# Patient Record
Sex: Male | Born: 1950 | Race: Black or African American | Hispanic: No | Marital: Single | Smoking: Former smoker
Health system: Southern US, Community
[De-identification: ages and names within clinical notes are randomized; demographics above are authoritative.]

## PROBLEM LIST (undated history)

## (undated) DIAGNOSIS — E785 Hyperlipidemia, unspecified: Secondary | ICD-10-CM

## (undated) DIAGNOSIS — K759 Inflammatory liver disease, unspecified: Secondary | ICD-10-CM

## (undated) DIAGNOSIS — I509 Heart failure, unspecified: Secondary | ICD-10-CM

## (undated) DIAGNOSIS — M1990 Unspecified osteoarthritis, unspecified site: Secondary | ICD-10-CM

## (undated) DIAGNOSIS — I429 Cardiomyopathy, unspecified: Secondary | ICD-10-CM

## (undated) DIAGNOSIS — F419 Anxiety disorder, unspecified: Secondary | ICD-10-CM

## (undated) DIAGNOSIS — B1920 Unspecified viral hepatitis C without hepatic coma: Secondary | ICD-10-CM

## (undated) DIAGNOSIS — I2699 Other pulmonary embolism without acute cor pulmonale: Secondary | ICD-10-CM

## (undated) DIAGNOSIS — K219 Gastro-esophageal reflux disease without esophagitis: Secondary | ICD-10-CM

## (undated) DIAGNOSIS — D649 Anemia, unspecified: Secondary | ICD-10-CM

## (undated) DIAGNOSIS — I10 Essential (primary) hypertension: Secondary | ICD-10-CM

## (undated) DIAGNOSIS — E559 Vitamin D deficiency, unspecified: Secondary | ICD-10-CM

## (undated) DIAGNOSIS — R55 Syncope and collapse: Secondary | ICD-10-CM

## (undated) DIAGNOSIS — M4800 Spinal stenosis, site unspecified: Secondary | ICD-10-CM

## (undated) DIAGNOSIS — R0600 Dyspnea, unspecified: Secondary | ICD-10-CM

## (undated) DIAGNOSIS — Z862 Personal history of diseases of the blood and blood-forming organs and certain disorders involving the immune mechanism: Secondary | ICD-10-CM

## (undated) DIAGNOSIS — J189 Pneumonia, unspecified organism: Secondary | ICD-10-CM

## (undated) DIAGNOSIS — J449 Chronic obstructive pulmonary disease, unspecified: Secondary | ICD-10-CM

## (undated) DIAGNOSIS — K7460 Unspecified cirrhosis of liver: Secondary | ICD-10-CM

## (undated) DIAGNOSIS — J45909 Unspecified asthma, uncomplicated: Secondary | ICD-10-CM

## (undated) DIAGNOSIS — K222 Esophageal obstruction: Secondary | ICD-10-CM

## (undated) DIAGNOSIS — N182 Chronic kidney disease, stage 2 (mild): Secondary | ICD-10-CM

## (undated) HISTORY — DX: Hyperlipidemia, unspecified: E78.5

## (undated) HISTORY — DX: Esophageal obstruction: K22.2

## (undated) HISTORY — DX: Unspecified asthma, uncomplicated: J45.909

## (undated) HISTORY — DX: Syncope and collapse: R55

## (undated) HISTORY — PX: LEG SURGERY: SHX1003

## (undated) HISTORY — PX: ESOPHAGEAL DILATION: SHX303

## (undated) HISTORY — DX: Anemia, unspecified: D64.9

---
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
## 1983-04-25 HISTORY — PX: ELBOW SURGERY: SHX618

## 2011-10-06 DIAGNOSIS — IMO0002 Reserved for concepts with insufficient information to code with codable children: Secondary | ICD-10-CM

## 2011-10-06 HISTORY — DX: Reserved for concepts with insufficient information to code with codable children: IMO0002

## 2012-08-19 DIAGNOSIS — B1920 Unspecified viral hepatitis C without hepatic coma: Secondary | ICD-10-CM

## 2012-08-19 DIAGNOSIS — I10 Essential (primary) hypertension: Secondary | ICD-10-CM

## 2012-08-19 HISTORY — DX: Essential (primary) hypertension: I10

## 2012-08-19 HISTORY — DX: Unspecified viral hepatitis C without hepatic coma: B19.20

## 2013-12-30 DIAGNOSIS — M48061 Spinal stenosis, lumbar region without neurogenic claudication: Secondary | ICD-10-CM

## 2013-12-30 DIAGNOSIS — M545 Low back pain, unspecified: Secondary | ICD-10-CM

## 2013-12-30 DIAGNOSIS — G4700 Insomnia, unspecified: Secondary | ICD-10-CM | POA: Insufficient documentation

## 2013-12-30 DIAGNOSIS — R42 Dizziness and giddiness: Secondary | ICD-10-CM | POA: Insufficient documentation

## 2013-12-30 HISTORY — DX: Insomnia, unspecified: G47.00

## 2013-12-30 HISTORY — DX: Spinal stenosis, lumbar region without neurogenic claudication: M48.061

## 2013-12-30 HISTORY — DX: Dizziness and giddiness: R42

## 2013-12-30 HISTORY — DX: Low back pain, unspecified: M54.50

## 2014-01-27 DIAGNOSIS — M47812 Spondylosis without myelopathy or radiculopathy, cervical region: Secondary | ICD-10-CM

## 2014-01-27 HISTORY — DX: Spondylosis without myelopathy or radiculopathy, cervical region: M47.812

## 2014-10-23 DIAGNOSIS — I2699 Other pulmonary embolism without acute cor pulmonale: Secondary | ICD-10-CM | POA: Insufficient documentation

## 2014-10-24 DIAGNOSIS — R0602 Shortness of breath: Secondary | ICD-10-CM | POA: Insufficient documentation

## 2014-10-24 HISTORY — DX: Shortness of breath: R06.02

## 2015-09-29 DIAGNOSIS — M1612 Unilateral primary osteoarthritis, left hip: Secondary | ICD-10-CM

## 2015-09-29 HISTORY — DX: Unilateral primary osteoarthritis, left hip: M16.12

## 2015-10-05 DIAGNOSIS — J449 Chronic obstructive pulmonary disease, unspecified: Secondary | ICD-10-CM

## 2015-10-05 DIAGNOSIS — R760 Raised antibody titer: Secondary | ICD-10-CM

## 2015-10-05 DIAGNOSIS — K7460 Unspecified cirrhosis of liver: Secondary | ICD-10-CM

## 2015-10-05 HISTORY — DX: Raised antibody titer: R76.0

## 2015-10-05 HISTORY — DX: Chronic obstructive pulmonary disease, unspecified: J44.9

## 2015-10-05 HISTORY — DX: Unspecified cirrhosis of liver: K74.60

## 2015-11-12 DIAGNOSIS — N179 Acute kidney failure, unspecified: Secondary | ICD-10-CM | POA: Diagnosis not present

## 2015-11-12 DIAGNOSIS — J9601 Acute respiratory failure with hypoxia: Secondary | ICD-10-CM

## 2015-11-12 DIAGNOSIS — J441 Chronic obstructive pulmonary disease with (acute) exacerbation: Secondary | ICD-10-CM

## 2015-11-12 DIAGNOSIS — J189 Pneumonia, unspecified organism: Secondary | ICD-10-CM | POA: Diagnosis not present

## 2015-11-12 DIAGNOSIS — E118 Type 2 diabetes mellitus with unspecified complications: Secondary | ICD-10-CM

## 2015-11-12 DIAGNOSIS — I10 Essential (primary) hypertension: Secondary | ICD-10-CM

## 2015-11-13 DIAGNOSIS — J441 Chronic obstructive pulmonary disease with (acute) exacerbation: Secondary | ICD-10-CM | POA: Diagnosis not present

## 2015-11-13 DIAGNOSIS — J9601 Acute respiratory failure with hypoxia: Secondary | ICD-10-CM | POA: Diagnosis not present

## 2015-11-13 DIAGNOSIS — I5022 Chronic systolic (congestive) heart failure: Secondary | ICD-10-CM

## 2015-11-13 DIAGNOSIS — N179 Acute kidney failure, unspecified: Secondary | ICD-10-CM | POA: Diagnosis not present

## 2015-11-13 DIAGNOSIS — J189 Pneumonia, unspecified organism: Secondary | ICD-10-CM | POA: Diagnosis not present

## 2015-11-14 DIAGNOSIS — J189 Pneumonia, unspecified organism: Secondary | ICD-10-CM | POA: Diagnosis not present

## 2015-11-14 DIAGNOSIS — J9601 Acute respiratory failure with hypoxia: Secondary | ICD-10-CM | POA: Diagnosis not present

## 2015-11-14 DIAGNOSIS — N179 Acute kidney failure, unspecified: Secondary | ICD-10-CM | POA: Diagnosis not present

## 2015-11-14 DIAGNOSIS — J441 Chronic obstructive pulmonary disease with (acute) exacerbation: Secondary | ICD-10-CM | POA: Diagnosis not present

## 2015-11-15 DIAGNOSIS — J9601 Acute respiratory failure with hypoxia: Secondary | ICD-10-CM | POA: Diagnosis not present

## 2015-11-15 DIAGNOSIS — J441 Chronic obstructive pulmonary disease with (acute) exacerbation: Secondary | ICD-10-CM | POA: Diagnosis not present

## 2015-11-15 DIAGNOSIS — N179 Acute kidney failure, unspecified: Secondary | ICD-10-CM | POA: Diagnosis not present

## 2015-11-15 DIAGNOSIS — J189 Pneumonia, unspecified organism: Secondary | ICD-10-CM | POA: Diagnosis not present

## 2015-11-16 DIAGNOSIS — J189 Pneumonia, unspecified organism: Secondary | ICD-10-CM | POA: Diagnosis not present

## 2015-11-16 DIAGNOSIS — J9601 Acute respiratory failure with hypoxia: Secondary | ICD-10-CM | POA: Diagnosis not present

## 2015-11-16 DIAGNOSIS — N179 Acute kidney failure, unspecified: Secondary | ICD-10-CM | POA: Diagnosis not present

## 2015-11-16 DIAGNOSIS — J441 Chronic obstructive pulmonary disease with (acute) exacerbation: Secondary | ICD-10-CM | POA: Diagnosis not present

## 2015-11-17 DIAGNOSIS — J189 Pneumonia, unspecified organism: Secondary | ICD-10-CM

## 2015-11-17 DIAGNOSIS — E118 Type 2 diabetes mellitus with unspecified complications: Secondary | ICD-10-CM

## 2015-11-17 DIAGNOSIS — N179 Acute kidney failure, unspecified: Secondary | ICD-10-CM | POA: Diagnosis not present

## 2015-11-17 DIAGNOSIS — I10 Essential (primary) hypertension: Secondary | ICD-10-CM

## 2015-11-17 DIAGNOSIS — J9601 Acute respiratory failure with hypoxia: Secondary | ICD-10-CM

## 2015-11-17 DIAGNOSIS — J441 Chronic obstructive pulmonary disease with (acute) exacerbation: Secondary | ICD-10-CM | POA: Diagnosis not present

## 2015-11-18 DIAGNOSIS — J189 Pneumonia, unspecified organism: Secondary | ICD-10-CM | POA: Diagnosis not present

## 2015-11-18 DIAGNOSIS — J441 Chronic obstructive pulmonary disease with (acute) exacerbation: Secondary | ICD-10-CM | POA: Diagnosis not present

## 2015-11-18 DIAGNOSIS — J9601 Acute respiratory failure with hypoxia: Secondary | ICD-10-CM | POA: Diagnosis not present

## 2015-11-18 DIAGNOSIS — N179 Acute kidney failure, unspecified: Secondary | ICD-10-CM | POA: Diagnosis not present

## 2016-02-02 ENCOUNTER — Other Ambulatory Visit: Payer: Self-pay | Admitting: Orthopedic Surgery

## 2016-03-05 NOTE — Pre-Procedure Instructions (Signed)
    Foy GuadalajaraVester D Belitz  03/05/2016      [facility] - [place] - [address] [place] [address] Phone: [phone] Fax: [phone]    Your procedure is scheduled on 03/20/16.  [facility] at 730 A.M.  Call this number if you have problems the morning of surgery:  [phone]   Remember:  Do not eat food or drink liquids after midnight.  Take these medicines the morning of surgery with A SIP OF WATER --all inhalers,atenolol.oxycodone,flomax   Do not wear jewelry, make-up or nail polish.  Do not wear lotions, powders, or perfumes, or deoderant.  Do not shave 48 hours prior to surgery.  Men may shave face and neck.  Do not bring valuables to the hospital.  [facility] is not responsible for any belongings or valuables.  Contacts, dentures or bridgework may not be worn into surgery.  Leave your suitcase in the car.  After surgery it may be brought to your room.  For patients admitted to the hospital, discharge time will be determined by your treatment team.  Patients discharged the day of surgery will not be allowed to drive home.   Name and phone number of your driver:   Special instructions: Do not take any aspirin,anti-inflammatories,vitamins,or herbal supplements 5-7 days prior to surgery.  Please read over the following fact sheets that you were given.

## 2016-03-06 ENCOUNTER — Encounter (HOSPITAL_COMMUNITY): Payer: Self-pay

## 2016-03-06 ENCOUNTER — Ambulatory Visit (HOSPITAL_COMMUNITY)
Admission: RE | Admit: 2016-03-06 | Discharge: 2016-03-06 | Disposition: A | Discharge status: Home / Self Care | Payer: Medicare Other | Source: Ambulatory Visit | Attending: Orthopedic Surgery | Admitting: Orthopedic Surgery

## 2016-03-06 ENCOUNTER — Other Ambulatory Visit: Payer: Self-pay

## 2016-03-06 ENCOUNTER — Encounter (HOSPITAL_COMMUNITY)
Admission: RE | Admit: 2016-03-06 | Discharge: 2016-03-06 | Disposition: A | Discharge status: Home / Self Care | Payer: Medicare Other | Source: Ambulatory Visit | Attending: Orthopedic Surgery | Admitting: Orthopedic Surgery

## 2016-03-06 DIAGNOSIS — Z01818 Encounter for other preprocedural examination: Secondary | ICD-10-CM | POA: Diagnosis not present

## 2016-03-06 HISTORY — DX: Chronic obstructive pulmonary disease, unspecified: J44.9

## 2016-03-06 HISTORY — DX: Inflammatory liver disease, unspecified: K75.9

## 2016-03-06 HISTORY — DX: Unspecified osteoarthritis, unspecified site: M19.90

## 2016-03-06 HISTORY — DX: Anxiety disorder, unspecified: F41.9

## 2016-03-06 HISTORY — DX: Dyspnea, unspecified: R06.00

## 2016-03-06 HISTORY — DX: Chronic kidney disease, stage 2 (mild): N18.2

## 2016-03-06 HISTORY — DX: Essential (primary) hypertension: I10

## 2016-03-06 LAB — URINALYSIS, ROUTINE W REFLEX MICROSCOPIC
BILIRUBIN URINE: NEGATIVE
GLUCOSE, UA: NEGATIVE mg/dL
HGB URINE DIPSTICK: NEGATIVE
Ketones, ur: NEGATIVE mg/dL
Leukocytes, UA: NEGATIVE
Nitrite: NEGATIVE
PH: 5.5 (ref 5.0–8.0)
Protein, ur: NEGATIVE mg/dL
SPECIFIC GRAVITY, URINE: 1.024 (ref 1.005–1.030)

## 2016-03-06 LAB — CBC WITH DIFFERENTIAL/PLATELET
BASOS ABS: 0 10*3/uL (ref 0.0–0.1)
Basophils Relative: 0 %
EOS PCT: 0 %
Eosinophils Absolute: 0 10*3/uL (ref 0.0–0.7)
HCT: 40.2 % (ref 39.0–52.0)
Hemoglobin: 13.1 g/dL (ref 13.0–17.0)
Lymphocytes Relative: 9 %
Lymphs Abs: 2.4 10*3/uL (ref 0.7–4.0)
MCH: 26.6 pg (ref 26.0–34.0)
MCHC: 32.6 g/dL (ref 30.0–36.0)
MCV: 81.5 fL (ref 78.0–100.0)
MONO ABS: 1.8 10*3/uL — AB (ref 0.1–1.0)
MONOS PCT: 7 %
NEUTROS PCT: 84 %
Neutro Abs: 22.2 10*3/uL — ABNORMAL HIGH (ref 1.7–7.7)
PLATELETS: 308 10*3/uL (ref 150–400)
RBC: 4.93 MIL/uL (ref 4.22–5.81)
RDW: 14.4 % (ref 11.5–15.5)
WBC: 26.4 10*3/uL — AB (ref 4.0–10.5)

## 2016-03-06 LAB — BASIC METABOLIC PANEL
Anion gap: 11 (ref 5–15)
BUN: 22 mg/dL — ABNORMAL HIGH (ref 6–20)
CHLORIDE: 100 mmol/L — AB (ref 101–111)
CO2: 23 mmol/L (ref 22–32)
CREATININE: 1.5 mg/dL — AB (ref 0.61–1.24)
Calcium: 9.1 mg/dL (ref 8.9–10.3)
GFR calc non Af Amer: 47 mL/min — ABNORMAL LOW (ref >60)
GFR, EST AFRICAN AMERICAN: 55 mL/min — AB (ref >60)
Glucose, Bld: 100 mg/dL — ABNORMAL HIGH (ref 65–99)
POTASSIUM: 3.8 mmol/L (ref 3.5–5.1)
SODIUM: 134 mmol/L — AB (ref 135–145)

## 2016-03-06 LAB — TYPE AND SCREEN
ABO/RH(D): A POS
ANTIBODY SCREEN: NEGATIVE

## 2016-03-06 LAB — PROTIME-INR
INR: 1.12
Prothrombin Time: 14.4 seconds (ref 11.4–15.2)

## 2016-03-06 LAB — APTT: aPTT: 37 seconds — ABNORMAL HIGH (ref 24–36)

## 2016-03-06 LAB — ABO/RH: ABO/RH(D): A POS

## 2016-03-06 LAB — SURGICAL PCR SCREEN
MRSA, PCR: NEGATIVE
Staphylococcus aureus: POSITIVE — AB

## 2016-03-07 ENCOUNTER — Encounter (HOSPITAL_COMMUNITY): Payer: Self-pay | Admitting: Emergency Medicine

## 2016-03-07 NOTE — Progress Notes (Addendum)
Anesthesia Chart Review:  Pt is a 65 year old male scheduled for L total hip arthroplasty anterior approach on 03/20/2016 with Todd BirchwoodFrank Rowan, MD.  - PCP is Todd LivingsSami Hassan, MD  - Nephrologist is Todd DeedJeanne Marie Zekan, MD  - Pulmologist is Todd Canneranvir Anwar Chodri, MD  PMH includes:  HTN, hx steroid induced DM, COPD, uses home oxygen at all times, hepatitis C, CKD (stage 2), hx cocaine use (reportedly last in 2014). Former smoker. BMI 23  Medications include: albuterol, ASA, atenolol, pulmicort, symbicort, hctz, lovastatin  Preoperative labs reviewed.   - Cr 1.5, BUN 22. Baseline Cr appears to be around 1.3 per records from Dr. Pamelia Cochran's office.   - WBC 26.4.   CXR 03/06/16: COPD.  No active disease.  EKG 03/06/16: NSR. Nonspecific T wave abnormality  I spoke with Todd Cochran in Dr. Wadie Cochran's office. She has forwarded pt's CBC lab results [facility] and has requested pulmonary clearance from Dr. Blenda Nicelyhodri. Will revisit chart when more information available.   Todd Mastngela Liev Brockbank, FNP-BC [facility]iology Phone: [phone] 03/08/2016 1:28 PM

## 2016-03-08 ENCOUNTER — Encounter (HOSPITAL_COMMUNITY): Payer: Self-pay

## 2016-03-09 DIAGNOSIS — Z01810 Encounter for preprocedural cardiovascular examination: Secondary | ICD-10-CM

## 2016-03-09 HISTORY — DX: Encounter for preprocedural cardiovascular examination: Z01.810

## 2016-03-20 ENCOUNTER — Encounter (HOSPITAL_COMMUNITY): Admission: RE | Payer: Self-pay | Source: Ambulatory Visit

## 2016-03-20 ENCOUNTER — Inpatient Hospital Stay (HOSPITAL_COMMUNITY): Admission: RE | Admit: 2016-03-20 | Payer: Medicare Other | Source: Ambulatory Visit | Admitting: Orthopedic Surgery

## 2016-03-20 SURGERY — ARTHROPLASTY, HIP, TOTAL, ANTERIOR APPROACH
Anesthesia: Spinal | Laterality: Left

## 2016-04-26 DIAGNOSIS — I255 Ischemic cardiomyopathy: Secondary | ICD-10-CM

## 2016-04-26 HISTORY — DX: Ischemic cardiomyopathy: I25.5

## 2016-05-03 DIAGNOSIS — I420 Dilated cardiomyopathy: Secondary | ICD-10-CM

## 2016-05-03 HISTORY — DX: Dilated cardiomyopathy: I42.0

## 2016-10-19 ENCOUNTER — Other Ambulatory Visit: Payer: Self-pay | Admitting: Orthopedic Surgery

## 2016-10-27 ENCOUNTER — Encounter (HOSPITAL_COMMUNITY)
Admission: RE | Admit: 2016-10-27 | Discharge: 2016-10-27 | Disposition: A | Discharge status: Home / Self Care | Payer: Medicare Other | Source: Ambulatory Visit | Attending: Orthopedic Surgery | Admitting: Orthopedic Surgery

## 2016-10-27 ENCOUNTER — Ambulatory Visit (HOSPITAL_COMMUNITY)
Admission: RE | Admit: 2016-10-27 | Discharge: 2016-10-27 | Disposition: A | Discharge status: Home / Self Care | Payer: Medicare Other | Source: Ambulatory Visit | Attending: Orthopedic Surgery | Admitting: Orthopedic Surgery

## 2016-10-27 ENCOUNTER — Encounter (HOSPITAL_COMMUNITY): Payer: Self-pay

## 2016-10-27 DIAGNOSIS — Z01818 Encounter for other preprocedural examination: Secondary | ICD-10-CM | POA: Insufficient documentation

## 2016-10-27 DIAGNOSIS — M1612 Unilateral primary osteoarthritis, left hip: Secondary | ICD-10-CM | POA: Diagnosis not present

## 2016-10-27 HISTORY — DX: Heart failure, unspecified: I50.9

## 2016-10-27 HISTORY — DX: Cardiomyopathy, unspecified: I42.9

## 2016-10-27 HISTORY — DX: Pneumonia, unspecified organism: J18.9

## 2016-10-27 LAB — URINALYSIS, ROUTINE W REFLEX MICROSCOPIC
BILIRUBIN URINE: NEGATIVE
Bacteria, UA: NONE SEEN
Glucose, UA: NEGATIVE mg/dL
Hgb urine dipstick: NEGATIVE
KETONES UR: NEGATIVE mg/dL
Nitrite: NEGATIVE
PH: 5 (ref 5.0–8.0)
Protein, ur: NEGATIVE mg/dL
Specific Gravity, Urine: 1.025 (ref 1.005–1.030)

## 2016-10-27 LAB — CBC WITH DIFFERENTIAL/PLATELET
BASOS ABS: 0 10*3/uL (ref 0.0–0.1)
BASOS PCT: 0 %
EOS ABS: 0.5 10*3/uL (ref 0.0–0.7)
EOS PCT: 4 %
HCT: 41.1 % (ref 39.0–52.0)
Hemoglobin: 13.1 g/dL (ref 13.0–17.0)
Lymphocytes Relative: 28 %
Lymphs Abs: 3 10*3/uL (ref 0.7–4.0)
MCH: 26.5 pg (ref 26.0–34.0)
MCHC: 31.9 g/dL (ref 30.0–36.0)
MCV: 83 fL (ref 78.0–100.0)
Monocytes Absolute: 0.7 10*3/uL (ref 0.1–1.0)
Monocytes Relative: 7 %
Neutro Abs: 6.8 10*3/uL (ref 1.7–7.7)
Neutrophils Relative %: 61 %
PLATELETS: 327 10*3/uL (ref 150–400)
RBC: 4.95 MIL/uL (ref 4.22–5.81)
RDW: 15.8 % — AB (ref 11.5–15.5)
WBC: 11 10*3/uL — ABNORMAL HIGH (ref 4.0–10.5)

## 2016-10-27 LAB — PROTIME-INR
INR: 1.02
Prothrombin Time: 13.4 seconds (ref 11.4–15.2)

## 2016-10-27 LAB — BASIC METABOLIC PANEL
Anion gap: 9 (ref 5–15)
BUN: 21 mg/dL — AB (ref 6–20)
CALCIUM: 9.3 mg/dL (ref 8.9–10.3)
CO2: 28 mmol/L (ref 22–32)
Chloride: 102 mmol/L (ref 101–111)
Creatinine, Ser: 1.49 mg/dL — ABNORMAL HIGH (ref 0.61–1.24)
GFR calc Af Amer: 55 mL/min — ABNORMAL LOW (ref >60)
GFR, EST NON AFRICAN AMERICAN: 48 mL/min — AB (ref >60)
GLUCOSE: 109 mg/dL — AB (ref 65–99)
Potassium: 4.4 mmol/L (ref 3.5–5.1)
Sodium: 139 mmol/L (ref 135–145)

## 2016-10-27 LAB — TYPE AND SCREEN
ABO/RH(D): A POS
Antibody Screen: NEGATIVE

## 2016-10-27 LAB — SURGICAL PCR SCREEN
MRSA, PCR: NEGATIVE
STAPHYLOCOCCUS AUREUS: POSITIVE — AB

## 2016-10-27 LAB — APTT: aPTT: 32 seconds (ref 24–36)

## 2016-10-27 NOTE — Progress Notes (Signed)
PCP is Dr. Quitman LivingsSami Hassan Pulmonary is Dr. Humberto Leepannr Chodri  States he saw him about a week ago. Denies ever seeing a cardiologist. Denies any chest pain, or fever. States he does cough up thick sputum at times. Wears o2 sat today on 2liters 94%  Request sent to Dr Blenda Nicelyhodri for last office visit.

## 2016-10-27 NOTE — Pre-Procedure Instructions (Signed)
Foy GuadalajaraVester D Gervacio  10/27/2016      [facility] - [place] - [address] [place] [address] Phone: [phone] Fax: [phone]  [facility] [place] - [address] [place] [address] Phone: [phone] Fax: [phone]    Your procedure is scheduled on July 18  [facility] Ad[facility] at 1045 A.M.  Call this number if you have problems the morning of surgery:  [phone]   Remember:  Do not eat food or drink liquids after midnight.  Take these medicines the morning of surgery with A SIP OF WATER Symbicort inhaler,  Albuterol inhaler if needed - bring your inhalers with you on the day of surgery, Flexeril, Duoneb, oxycodone if  Needed  Stop taking  BC's, Goody's, Herbal medications, Fish oil, Aleve,n Anaprox,  Ibuprofen, Advil, Motrin Stop/ Take Asprin as directed by your Dr.    Drucilla Schmidto not wear jewelry, make-up or nail polish.  Do not wear lotions, powders, or perfumes, or deoderant.  Do not shave 48 hours prior to surgery.  Men may shave face and neck.  Do not bring valuables to the hospital.  [facility] is not responsible for any belongings or valuables.  Contacts, dentures or bridgework may not be worn into surgery.  Leave your suitcase in the car.  After surgery it may be brought to your room.  For patients admitted to the hospital, discharge time will be determined by your treatment team.  Patients discharged the day of surgery will not be allowed to drive home.    Special instructions:  [place] - Preparing for Surgery  Before surgery, you can play an important role.  Because skin is not sterile, your skin needs to be as free of germs as possible.  You can reduce the number of germs on you skin by washing with CHG (chlorahexidine gluconate) soap before surgery.  CHG is an antiseptic cleaner which kills germs and bonds with the skin to continue  killing germs even after washing.  Please DO NOT use if you have an allergy to CHG or antibacterial soaps.  If your skin becomes reddened/irritated stop using the CHG and inform your nurse when you arrive at Short Stay.  Do not shave (including legs and underarms) for at least 48 hours prior to the first CHG shower.  You may shave your face.  Please follow these instructions carefully:   1.  Shower with CHG Soap the night before surgery and the                                morning of Surgery.  2.  If you choose to wash your hair, wash your hair first as usual with your       normal shampoo.  3.  After you shampoo, rinse your hair and body thoroughly to remove the                      Shampoo.  4.  Use CHG as you would any other liquid soap.  You can apply chg directly       to the skin and wash gently with scrungie or a clean washcloth.  5.  Apply the CHG Soap to your body ONLY FROM THE NECK DOWN.        Do not use on open  wounds or open sores.  Avoid contact with your eyes,       ears, mouth and genitals (private parts).  Wash genitals (private parts)       with your normal soap.  6.  Wash thoroughly, paying special attention to the area where your surgery        will be performed.  7.  Thoroughly rinse your body with warm water from the neck down.  8.  DO NOT shower/wash with your normal soap after using and rinsing off       the CHG Soap.  9.  Pat yourself dry with a clean towel.            10.  Wear clean pajamas.            11.  Place clean sheets on your bed the night of your first shower and do not        sleep with pets.  Day of Surgery  Do not apply any lotions/deoderants the morning of surgery.  Please wear clean clothes to the hospital/surgery center.     Please read over the following fact sheets that you were given. Pain Booklet, Coughing and Deep Breathing, MRSA Information and Surgical Site Infection Prevention, Incentive Spirometry

## 2016-10-27 NOTE — Progress Notes (Signed)
Script For Mupriocin called into [facility]. Message left for Mr Todd Cochran  informed of PCR results and the need to pick up the Script .

## 2016-10-30 ENCOUNTER — Encounter (HOSPITAL_COMMUNITY): Payer: Self-pay

## 2016-10-30 NOTE — Progress Notes (Addendum)
Anesthesia Chart Review:  Pt is a 66 year old male scheduled for L total hip arthroplasty anterior approach, removal of hardware on 11/08/2016 with Gean BirchwoodFrank Rowan, M.D.  - PCP is Quitman LivingsSami Hassan, MD  - Nephrologist is Maud DeedJeanne Marie Zekan, MD   - Pulmologist is Florence Canneranvir Anwar Chodri, MD who cleared pt at moderately high risk due to respiratory issues for surgery at last office visit 10/17/16. Dr. Blenda Nicelyhodri recommends "patient should be given a dose of Solu-Medrol 125 mg IV a few hours before surgery and should be given a nebulizer treatment an hour before and also after anesthesia. Patient should be on the lowest oxygen (post surgery) to keep the pulse ox between 90-92%. Patient may need CPAP after anesthesia."  - Cardiologist is Bonnielee HaffKenneth Wallmeyer, MD who cleared pt for surgery at last office visit 05/18/16 (notes in care everywhere)  - GI/hepatologist is Brooke DareSteven Zacks, MD (notes in care everywhere)   PMH includes:  dilated cardiomyopathy, CHF, HTN, hx steroid induced DM, COPD, uses home oxygen at all times, hepatitis C, CKD (stage 2), hx cocaine use (reportedly last in 2014). Former smoker. BMI 24  Medications include: albuterol, ASA, symbicort, hctz, DuoNeb, lovastatin  Preoperative labs reviewed.   - Cr 1.49, BUN 21.  - BMET only obtained. Will obtain HFP DOS.   CXR 10/27/16: No acute cardiopulmonary disease.  EKG 03/06/16: NSR. Nonspecific T wave abnormality  Cardiac cath 05/03/16:  1. Normal Coronaries. 2. Mild LV dysfunction. EF 40%  Echo 03/30/16 [facility]([place] [facility] [place]): 1. LV cavity normal in size. Visual EF 40-45%. Grade I diastolic dysfunction, normal LAP. LV regional wall motion findings: Basal anterolateral, mid anterolateral, and mid inferoateral hypokinesis. Calculated EF 47%.  If no changes, I anticipate pt can proceed with surgery as scheduled.   Rica Mastngela Kabbe, FNP-BC [facility]/Anesthesiology Phone: [phone] 10/31/2016 10:17 AM

## 2016-11-03 DIAGNOSIS — M1612 Unilateral primary osteoarthritis, left hip: Secondary | ICD-10-CM | POA: Diagnosis present

## 2016-11-03 HISTORY — DX: Unilateral primary osteoarthritis, left hip: M16.12

## 2016-11-03 NOTE — H&P (Signed)
TOTAL HIP ADMISSION H&P  Patient is admitted for left total hip arthroplasty.  Subjective:  Chief Complaint: left hip pain  HPI: Todd Cochran, 66 y.o. male, has a history of pain and functional disability in the left hip(s) due to arthritis and patient has failed non-surgical conservative treatments for greater than 12 weeks to include NSAID's and/or analgesics, corticosteriod injections, use of assistive devices, weight reduction as appropriate and activity modification.  Onset of symptoms was gradual starting 2 years ago with gradually worsening course since that time.The patient noted prior procedures of the hip to include femur fracture requiring plate and screws on the left hip(s).  Patient currently rates pain in the left hip at 10 out of 10 with activity. Patient has night pain, worsening of pain with activity and weight bearing, trendelenberg gait, pain that interfers with activities of daily living and pain with passive range of motion. Patient has evidence of subchondral cysts, subchondral sclerosis and joint space narrowing by imaging studies. This condition presents safety issues increasing the risk of falls. This patient has had mid femur fracture requiring plate and screws.  There is no current active infection.  There are no active problems to display for this patient.  Past Medical History:  Diagnosis Date  . Anxiety   . Arthritis   . Cardiomyopathy (HCC)   . CHF (congestive heart failure) (HCC)   . CKD (chronic kidney disease), stage II   . COPD (chronic obstructive pulmonary disease) (HCC)   . Dyspnea   . Hepatitis    hep c  . Hypertension   . Pneumonia     Past Surgical History:  Procedure Laterality Date  . ELBOW SURGERY Right 1985  . LEG SURGERY     screws,plates    No prescriptions prior to admission.   Allergies  Allergen Reactions  . Ivp Dye [Iodinated Diagnostic Agents] Anaphylaxis    Social History  Substance Use Topics  . Smoking status: Former  Games developermoker  . Smokeless tobacco: Never Used  . Alcohol use No     Comment: stopped 30 yrs    Family History  Problem Relation Age of Onset  . High blood pressure Mother   . Diabetes Mother   . Asthma Sister   . Cancer Brother      Review of Systems  Constitutional: Negative.   HENT: Negative.   Eyes: Negative.   Respiratory: Positive for shortness of breath.        COPD and emphysema  Cardiovascular:       HTN  Gastrointestinal: Negative.   Genitourinary: Negative.   Musculoskeletal: Positive for joint pain and myalgias.  Skin: Negative.   Neurological: Negative.   Endo/Heme/Allergies:       Blood sugar problem  Psychiatric/Behavioral: Negative.     Objective:  Physical Exam  Constitutional: He is oriented to person, place, and time. He appears well-developed and well-nourished.  HENT:  Head: Normocephalic and atraumatic.  Eyes: Pupils are equal, round, and reactive to light.  Neck: Normal range of motion. Neck supple.  Cardiovascular: Intact distal pulses.   Respiratory: Effort normal.  Musculoskeletal: He exhibits tenderness.  Patient has severe pain with any attempted internal or external rotation left hip.  Tender over the greater trochanter.  Foot tap is negative.    Neurological: He is alert and oriented to person, place, and time.  Skin: Skin is warm and dry.  Psychiatric: He has a normal mood and affect. His behavior is normal. Judgment and thought content normal.  Vital signs in last 24 hours:    Labs:   Estimated body mass index is 23.77 kg/m as calculated from the following:   Height as of 10/27/16: 5\' 6"  (1.676 m).   Weight as of 10/27/16: 66.8 kg (147 lb 4.3 oz).   Imaging Review Plain radiographs demonstrate  a left hip with end-stage bone-on-bone arthritis over the superior weightbearing surface.  He also has intact hardware from a previous femur fracture.  This is a plate approximately the midshaft of the femur.  The plate system should not be  affected by an anterior approach/Tri-Lock stem.  Assessment/Plan:  End stage arthritis, left hip(s)  The patient history, physical examination, clinical judgement of the provider and imaging studies are consistent with end stage degenerative joint disease of the left hip(s) and total hip arthroplasty is deemed medically necessary. The treatment options including medical management, injection therapy, arthroscopy and arthroplasty were discussed at length. The risks and benefits of total hip arthroplasty were presented and reviewed. The risks due to aseptic loosening, infection, stiffness, dislocation/subluxation,  thromboembolic complications and other imponderables were discussed.  The patient acknowledged the explanation, agreed to proceed with the plan and consent was signed. Patient is being admitted for inpatient treatment for surgery, pain control, PT, OT, prophylactic antibiotics, VTE prophylaxis, progressive ambulation and ADL's and discharge planning.The patient is planning to be discharged to skilled nursing facility.

## 2016-11-07 MED ORDER — CEFAZOLIN SODIUM-DEXTROSE 2-4 GM/100ML-% IV SOLN
2.0000 g | INTRAVENOUS | Status: AC
  Administered 2016-11-08: 2 g via INTRAVENOUS
  Filled 2016-11-07: qty 100

## 2016-11-07 MED ORDER — ALBUTEROL SULFATE (2.5 MG/3ML) 0.083% IN NEBU
2.5000 mg | INHALATION_SOLUTION | RESPIRATORY_TRACT | Status: DC
  Filled 2016-11-07: qty 3

## 2016-11-07 MED ORDER — TRANEXAMIC ACID 1000 MG/10ML IV SOLN
1000.0000 mg | INTRAVENOUS | Status: AC
  Administered 2016-11-08: 1000 mg via INTRAVENOUS
  Filled 2016-11-07: qty 10

## 2016-11-07 MED ORDER — BUPIVACAINE LIPOSOME 1.3 % IJ SUSP
20.0000 mL | Freq: Once | INTRAMUSCULAR | Status: AC
  Administered 2016-11-08: 20 mL
  Filled 2016-11-07: qty 20

## 2016-11-07 MED ORDER — DEXTROSE-NACL 5-0.45 % IV SOLN: INTRAVENOUS | Status: DC

## 2016-11-07 MED ORDER — TRANEXAMIC ACID 1000 MG/10ML IV SOLN
2000.0000 mg | INTRAVENOUS | Status: AC
  Administered 2016-11-08: 2000 mg via TOPICAL
  Filled 2016-11-07: qty 20

## 2016-11-07 MED ORDER — METHYLPREDNISOLONE SODIUM SUCC 125 MG IJ SOLR
125.0000 mg | Freq: Once | INTRAMUSCULAR | Status: AC
  Administered 2016-11-08: 125 mg via INTRAVENOUS
  Filled 2016-11-07: qty 2

## 2016-11-08 ENCOUNTER — Encounter (HOSPITAL_COMMUNITY): Payer: Self-pay

## 2016-11-08 ENCOUNTER — Inpatient Hospital Stay (HOSPITAL_COMMUNITY)
Admission: RE | Admit: 2016-11-08 | Discharge: 2016-11-09 | DRG: 470 | Disposition: A | Discharge status: Home Health | Payer: Medicare Other | Source: Ambulatory Visit | Attending: Orthopedic Surgery | Admitting: Orthopedic Surgery

## 2016-11-08 ENCOUNTER — Inpatient Hospital Stay (HOSPITAL_COMMUNITY): Payer: Medicare Other | Admitting: Emergency Medicine

## 2016-11-08 ENCOUNTER — Inpatient Hospital Stay (HOSPITAL_COMMUNITY): Payer: Medicare Other

## 2016-11-08 ENCOUNTER — Encounter (HOSPITAL_COMMUNITY)
Admission: RE | Disposition: A | Discharge status: Home Health | Payer: Self-pay | Source: Ambulatory Visit | Attending: Orthopedic Surgery

## 2016-11-08 DIAGNOSIS — M1612 Unilateral primary osteoarthritis, left hip: Secondary | ICD-10-CM | POA: Diagnosis present

## 2016-11-08 DIAGNOSIS — I429 Cardiomyopathy, unspecified: Secondary | ICD-10-CM | POA: Diagnosis present

## 2016-11-08 DIAGNOSIS — M25552 Pain in left hip: Secondary | ICD-10-CM | POA: Diagnosis present

## 2016-11-08 DIAGNOSIS — D62 Acute posthemorrhagic anemia: Secondary | ICD-10-CM | POA: Diagnosis not present

## 2016-11-08 DIAGNOSIS — B1920 Unspecified viral hepatitis C without hepatic coma: Secondary | ICD-10-CM | POA: Diagnosis present

## 2016-11-08 DIAGNOSIS — I509 Heart failure, unspecified: Secondary | ICD-10-CM | POA: Diagnosis present

## 2016-11-08 DIAGNOSIS — N182 Chronic kidney disease, stage 2 (mild): Secondary | ICD-10-CM | POA: Diagnosis present

## 2016-11-08 DIAGNOSIS — J449 Chronic obstructive pulmonary disease, unspecified: Secondary | ICD-10-CM | POA: Diagnosis present

## 2016-11-08 DIAGNOSIS — Z09 Encounter for follow-up examination after completed treatment for conditions other than malignant neoplasm: Secondary | ICD-10-CM

## 2016-11-08 DIAGNOSIS — I130 Hypertensive heart and chronic kidney disease with heart failure and stage 1 through stage 4 chronic kidney disease, or unspecified chronic kidney disease: Secondary | ICD-10-CM | POA: Diagnosis present

## 2016-11-08 HISTORY — PX: TOTAL HIP ARTHROPLASTY: SHX124

## 2016-11-08 LAB — HEPATIC FUNCTION PANEL
ALBUMIN: 3.8 g/dL (ref 3.5–5.0)
ALT: 21 U/L (ref 17–63)
AST: 23 U/L (ref 15–41)
Alkaline Phosphatase: 72 U/L (ref 38–126)
BILIRUBIN DIRECT: 0.1 mg/dL (ref 0.1–0.5)
Indirect Bilirubin: 0.9 mg/dL (ref 0.3–0.9)
TOTAL PROTEIN: 7 g/dL (ref 6.5–8.1)
Total Bilirubin: 1 mg/dL (ref 0.3–1.2)

## 2016-11-08 SURGERY — ARTHROPLASTY, HIP, TOTAL, ANTERIOR APPROACH
Anesthesia: Monitor Anesthesia Care | Laterality: Left

## 2016-11-08 MED ORDER — NAPROXEN SODIUM 550 MG PO TABS: 660.0000 mg | ORAL_TABLET | Freq: Two times a day (BID) | ORAL | Status: DC | PRN

## 2016-11-08 MED ORDER — ALBUTEROL SULFATE (2.5 MG/3ML) 0.083% IN NEBU
2.5000 mg | INHALATION_SOLUTION | Freq: Once | RESPIRATORY_TRACT | Status: AC
  Administered 2016-11-08: 2.5 mg via RESPIRATORY_TRACT

## 2016-11-08 MED ORDER — 0.9 % SODIUM CHLORIDE (POUR BTL) OPTIME
TOPICAL | Status: DC | PRN
  Administered 2016-11-08: 1000 mL

## 2016-11-08 MED ORDER — NYSTATIN 100000 UNIT/ML MT SUSP
5.0000 mL | Freq: Four times a day (QID) | OROMUCOSAL | Status: DC | PRN
  Filled 2016-11-08: qty 5

## 2016-11-08 MED ORDER — IPRATROPIUM-ALBUTEROL 0.5-2.5 (3) MG/3ML IN SOLN
3.0000 mL | Freq: Four times a day (QID) | RESPIRATORY_TRACT | Status: DC
  Administered 2016-11-08 – 2016-11-09 (×2): 3 mL via RESPIRATORY_TRACT
  Filled 2016-11-08 (×2): qty 3

## 2016-11-08 MED ORDER — BLISTEX MEDICATED EX OINT
1.0000 "application " | TOPICAL_OINTMENT | CUTANEOUS | Status: DC | PRN
  Filled 2016-11-08: qty 6.3

## 2016-11-08 MED ORDER — ONDANSETRON HCL 4 MG/2ML IJ SOLN: 4.0000 mg | Freq: Four times a day (QID) | INTRAMUSCULAR | Status: DC | PRN

## 2016-11-08 MED ORDER — BUPIVACAINE-EPINEPHRINE (PF) 0.5% -1:200000 IJ SOLN
INTRAMUSCULAR | Status: AC
  Filled 2016-11-08: qty 60

## 2016-11-08 MED ORDER — HYDROMORPHONE HCL 1 MG/ML IJ SOLN
0.5000 mg | INTRAMUSCULAR | Status: DC | PRN
  Administered 2016-11-08: 0.5 mg via INTRAVENOUS

## 2016-11-08 MED ORDER — PROPOFOL 1000 MG/100ML IV EMUL
INTRAVENOUS | Status: AC
  Filled 2016-11-08: qty 100

## 2016-11-08 MED ORDER — FENTANYL CITRATE (PF) 250 MCG/5ML IJ SOLN
INTRAMUSCULAR | Status: AC
  Filled 2016-11-08: qty 5

## 2016-11-08 MED ORDER — ACETAMINOPHEN 650 MG RE SUPP: 650.0000 mg | Freq: Four times a day (QID) | RECTAL | Status: DC | PRN

## 2016-11-08 MED ORDER — AMITRIPTYLINE HCL 50 MG PO TABS
50.0000 mg | ORAL_TABLET | Freq: Every day | ORAL | Status: DC
  Administered 2016-11-08: 50 mg via ORAL
  Filled 2016-11-08: qty 1

## 2016-11-08 MED ORDER — OXYCODONE HCL 5 MG PO TABS
5.0000 mg | ORAL_TABLET | ORAL | Status: DC | PRN
  Administered 2016-11-08 – 2016-11-09 (×6): 10 mg via ORAL
  Filled 2016-11-08 (×5): qty 2

## 2016-11-08 MED ORDER — FENTANYL CITRATE (PF) 100 MCG/2ML IJ SOLN
INTRAMUSCULAR | Status: DC | PRN
  Administered 2016-11-08: 50 ug via INTRAVENOUS

## 2016-11-08 MED ORDER — HYDROMORPHONE HCL 1 MG/ML IJ SOLN
INTRAMUSCULAR | Status: AC
  Administered 2016-11-08: 0.5 mg via INTRAVENOUS
  Filled 2016-11-08: qty 0.5

## 2016-11-08 MED ORDER — MIDAZOLAM HCL 2 MG/2ML IJ SOLN
INTRAMUSCULAR | Status: AC
  Filled 2016-11-08: qty 2

## 2016-11-08 MED ORDER — POLYETHYLENE GLYCOL 3350 17 G PO PACK: 17.0000 g | PACK | Freq: Every day | ORAL | Status: DC | PRN

## 2016-11-08 MED ORDER — BUPIVACAINE IN DEXTROSE 0.75-8.25 % IT SOLN
INTRATHECAL | Status: DC | PRN
  Administered 2016-11-08: 15 mg via INTRATHECAL

## 2016-11-08 MED ORDER — OXYCODONE HCL 5 MG PO TABS
ORAL_TABLET | ORAL | Status: AC
  Administered 2016-11-08: 10 mg via ORAL
  Filled 2016-11-08: qty 2

## 2016-11-08 MED ORDER — METOCLOPRAMIDE HCL 5 MG PO TABS: 5.0000 mg | ORAL_TABLET | Freq: Three times a day (TID) | ORAL | Status: DC | PRN

## 2016-11-08 MED ORDER — LACTATED RINGERS IV SOLN
INTRAVENOUS | Status: DC | PRN
  Administered 2016-11-08 (×2): via INTRAVENOUS

## 2016-11-08 MED ORDER — FLEET ENEMA 7-19 GM/118ML RE ENEM: 1.0000 | ENEMA | Freq: Once | RECTAL | Status: DC | PRN

## 2016-11-08 MED ORDER — DIPHENHYDRAMINE HCL 12.5 MG/5ML PO ELIX: 12.5000 mg | ORAL_SOLUTION | ORAL | Status: DC | PRN

## 2016-11-08 MED ORDER — PRAVASTATIN SODIUM 20 MG PO TABS
20.0000 mg | ORAL_TABLET | Freq: Every day | ORAL | Status: DC
  Administered 2016-11-08: 20 mg via ORAL
  Filled 2016-11-08: qty 1

## 2016-11-08 MED ORDER — METHOCARBAMOL 1000 MG/10ML IJ SOLN
500.0000 mg | Freq: Four times a day (QID) | INTRAVENOUS | Status: DC | PRN
  Filled 2016-11-08: qty 5

## 2016-11-08 MED ORDER — PHENOL 1.4 % MT LIQD: 1.0000 | OROMUCOSAL | Status: DC | PRN

## 2016-11-08 MED ORDER — ATENOLOL 50 MG PO TABS
25.0000 mg | ORAL_TABLET | Freq: Every day | ORAL | Status: DC
  Administered 2016-11-09: 25 mg via ORAL
  Filled 2016-11-08: qty 1

## 2016-11-08 MED ORDER — DEXAMETHASONE SODIUM PHOSPHATE 10 MG/ML IJ SOLN
10.0000 mg | Freq: Once | INTRAMUSCULAR | Status: AC
  Administered 2016-11-09: 10 mg via INTRAVENOUS
  Filled 2016-11-08: qty 1

## 2016-11-08 MED ORDER — METOCLOPRAMIDE HCL 5 MG/ML IJ SOLN: 5.0000 mg | Freq: Three times a day (TID) | INTRAMUSCULAR | Status: DC | PRN

## 2016-11-08 MED ORDER — PROPOFOL 10 MG/ML IV BOLUS
INTRAVENOUS | Status: DC | PRN
  Administered 2016-11-08: 10 mg via INTRAVENOUS

## 2016-11-08 MED ORDER — METHOCARBAMOL 500 MG PO TABS
500.0000 mg | ORAL_TABLET | Freq: Four times a day (QID) | ORAL | Status: DC | PRN
  Administered 2016-11-08 – 2016-11-09 (×3): 500 mg via ORAL
  Filled 2016-11-08 (×3): qty 1

## 2016-11-08 MED ORDER — SODIUM CHLORIDE 0.9 % IV SOLN
INTRAVENOUS | Status: DC
  Administered 2016-11-08: 12:00:00 via INTRAVENOUS

## 2016-11-08 MED ORDER — PHENYLEPHRINE HCL 10 MG/ML IJ SOLN
INTRAMUSCULAR | Status: DC | PRN
  Administered 2016-11-08: 25 ug/min via INTRAVENOUS

## 2016-11-08 MED ORDER — ALBUTEROL SULFATE (2.5 MG/3ML) 0.083% IN NEBU: 3.0000 mL | INHALATION_SOLUTION | Freq: Four times a day (QID) | RESPIRATORY_TRACT | Status: DC | PRN

## 2016-11-08 MED ORDER — PHENYLEPHRINE 40 MCG/ML (10ML) SYRINGE FOR IV PUSH (FOR BLOOD PRESSURE SUPPORT)
PREFILLED_SYRINGE | INTRAVENOUS | Status: AC
  Filled 2016-11-08: qty 10

## 2016-11-08 MED ORDER — PHENYLEPHRINE HCL 10 MG/ML IJ SOLN
INTRAMUSCULAR | Status: DC | PRN
  Administered 2016-11-08 (×2): 80 ug via INTRAVENOUS

## 2016-11-08 MED ORDER — ALBUMIN HUMAN 5 % IV SOLN
INTRAVENOUS | Status: DC | PRN
  Administered 2016-11-08: 13:00:00 via INTRAVENOUS

## 2016-11-08 MED ORDER — BISACODYL 5 MG PO TBEC: 5.0000 mg | DELAYED_RELEASE_TABLET | Freq: Every day | ORAL | Status: DC | PRN

## 2016-11-08 MED ORDER — ASPIRIN EC 325 MG PO TBEC
325.0000 mg | DELAYED_RELEASE_TABLET | Freq: Every day | ORAL | Status: DC
  Administered 2016-11-09: 325 mg via ORAL
  Filled 2016-11-08: qty 1

## 2016-11-08 MED ORDER — MENTHOL 3 MG MT LOZG: 1.0000 | LOZENGE | OROMUCOSAL | Status: DC | PRN

## 2016-11-08 MED ORDER — TAMSULOSIN HCL 0.4 MG PO CAPS
0.4000 mg | ORAL_CAPSULE | Freq: Every day | ORAL | Status: DC
  Administered 2016-11-08: 0.4 mg via ORAL
  Filled 2016-11-08: qty 1

## 2016-11-08 MED ORDER — TRANEXAMIC ACID 1000 MG/10ML IV SOLN
1000.0000 mg | Freq: Once | INTRAVENOUS | Status: AC
  Administered 2016-11-08: 1000 mg via INTRAVENOUS
  Filled 2016-11-08: qty 10

## 2016-11-08 MED ORDER — OXYCODONE HCL 5 MG PO TABS: 15.0000 mg | ORAL_TABLET | Freq: Four times a day (QID) | ORAL | Status: DC | PRN

## 2016-11-08 MED ORDER — ACETAMINOPHEN 325 MG PO TABS: 650.0000 mg | ORAL_TABLET | Freq: Four times a day (QID) | ORAL | Status: DC | PRN

## 2016-11-08 MED ORDER — DOCUSATE SODIUM 100 MG PO CAPS
100.0000 mg | ORAL_CAPSULE | Freq: Two times a day (BID) | ORAL | Status: DC
  Administered 2016-11-08 – 2016-11-09 (×2): 100 mg via ORAL
  Filled 2016-11-08 (×2): qty 1

## 2016-11-08 MED ORDER — BUPIVACAINE-EPINEPHRINE (PF) 0.5% -1:200000 IJ SOLN
INTRAMUSCULAR | Status: DC | PRN
  Administered 2016-11-08: 50 mL via PERINEURAL

## 2016-11-08 MED ORDER — MIDAZOLAM HCL 5 MG/5ML IJ SOLN
INTRAMUSCULAR | Status: DC | PRN
  Administered 2016-11-08: 2 mg via INTRAVENOUS

## 2016-11-08 MED ORDER — MOMETASONE FURO-FORMOTEROL FUM 200-5 MCG/ACT IN AERO
2.0000 | INHALATION_SPRAY | Freq: Two times a day (BID) | RESPIRATORY_TRACT | Status: DC
  Administered 2016-11-08 – 2016-11-09 (×2): 2 via RESPIRATORY_TRACT
  Filled 2016-11-08: qty 8.8

## 2016-11-08 MED ORDER — PROPOFOL 500 MG/50ML IV EMUL
INTRAVENOUS | Status: DC | PRN
  Administered 2016-11-08: 75 ug/kg/min via INTRAVENOUS

## 2016-11-08 MED ORDER — ONDANSETRON HCL 4 MG PO TABS: 4.0000 mg | ORAL_TABLET | Freq: Four times a day (QID) | ORAL | Status: DC | PRN

## 2016-11-08 MED ORDER — KCL IN DEXTROSE-NACL 20-5-0.45 MEQ/L-%-% IV SOLN: INTRAVENOUS | Status: DC

## 2016-11-08 MED ORDER — ALUMINUM HYDROXIDE GEL 320 MG/5ML PO SUSP
15.0000 mL | ORAL | Status: DC | PRN
  Filled 2016-11-08: qty 30

## 2016-11-08 MED ORDER — HYDROCHLOROTHIAZIDE 25 MG PO TABS
25.0000 mg | ORAL_TABLET | Freq: Every day | ORAL | Status: DC
Start: 2016-11-09 — End: 2016-11-09
  Administered 2016-11-09: 25 mg via ORAL
  Filled 2016-11-08: qty 1

## 2016-11-08 SURGICAL SUPPLY — 43 items
BAG DECANTER FOR FLEXI CONT (MISCELLANEOUS) ×3 IMPLANT
BLADE SAW SGTL 18X1.27X75 (BLADE) ×2 IMPLANT
BLADE SAW SGTL 18X1.27X75MM (BLADE) ×1
CAPT HIP TOTAL 2 ×3 IMPLANT
COVER PERINEAL POST (MISCELLANEOUS) ×3 IMPLANT
COVER SURGICAL LIGHT HANDLE (MISCELLANEOUS) ×3 IMPLANT
DRAPE C-ARM 42X72 X-RAY (DRAPES) ×3 IMPLANT
DRAPE STERI IOBAN 125X83 (DRAPES) ×3 IMPLANT
DRAPE U-SHAPE 47X51 STRL (DRAPES) ×6 IMPLANT
DRSG AQUACEL AG ADV 3.5X10 (GAUZE/BANDAGES/DRESSINGS) ×3 IMPLANT
DURAPREP 26ML APPLICATOR (WOUND CARE) ×3 IMPLANT
ELECT BLADE 4.0 EZ CLEAN MEGAD (MISCELLANEOUS) ×3
ELECT REM PT RETURN 9FT ADLT (ELECTROSURGICAL) ×3
ELECTRODE BLDE 4.0 EZ CLN MEGD (MISCELLANEOUS) ×1 IMPLANT
ELECTRODE REM PT RTRN 9FT ADLT (ELECTROSURGICAL) ×1 IMPLANT
FACESHIELD WRAPAROUND (MASK) ×6 IMPLANT
GLOVE BIO SURGEON STRL SZ7.5 (GLOVE) ×3 IMPLANT
GLOVE BIO SURGEON STRL SZ8.5 (GLOVE) ×3 IMPLANT
GLOVE BIOGEL PI IND STRL 8 (GLOVE) ×1 IMPLANT
GLOVE BIOGEL PI IND STRL 9 (GLOVE) ×1 IMPLANT
GLOVE BIOGEL PI INDICATOR 8 (GLOVE) ×2
GLOVE BIOGEL PI INDICATOR 9 (GLOVE) ×2
GOWN STRL REUS W/ TWL LRG LVL3 (GOWN DISPOSABLE) ×1 IMPLANT
GOWN STRL REUS W/ TWL XL LVL3 (GOWN DISPOSABLE) ×2 IMPLANT
GOWN STRL REUS W/TWL LRG LVL3 (GOWN DISPOSABLE) ×2
GOWN STRL REUS W/TWL XL LVL3 (GOWN DISPOSABLE) ×4
KIT BASIN OR (CUSTOM PROCEDURE TRAY) ×3 IMPLANT
KIT ROOM TURNOVER OR (KITS) ×3 IMPLANT
MANIFOLD NEPTUNE II (INSTRUMENTS) ×3 IMPLANT
NEEDLE HYPO 22GX1.5 SAFETY (NEEDLE) ×6 IMPLANT
NS IRRIG 1000ML POUR BTL (IV SOLUTION) ×3 IMPLANT
PACK TOTAL JOINT (CUSTOM PROCEDURE TRAY) ×3 IMPLANT
PAD ARMBOARD 7.5X6 YLW CONV (MISCELLANEOUS) ×6 IMPLANT
SUT VIC AB 1 CTX 36 (SUTURE) ×2
SUT VIC AB 1 CTX36XBRD ANBCTR (SUTURE) ×1 IMPLANT
SUT VIC AB 2-0 CT1 27 (SUTURE) ×2
SUT VIC AB 2-0 CT1 TAPERPNT 27 (SUTURE) ×1 IMPLANT
SUT VIC AB 3-0 PS2 18 (SUTURE) ×2
SUT VIC AB 3-0 PS2 18XBRD (SUTURE) ×1 IMPLANT
SYR CONTROL 10ML LL (SYRINGE) ×6 IMPLANT
TOWEL OR 17X24 6PK STRL BLUE (TOWEL DISPOSABLE) ×3 IMPLANT
TOWEL OR 17X26 10 PK STRL BLUE (TOWEL DISPOSABLE) ×3 IMPLANT
TRAY CATH 16FR W/PLASTIC CATH (SET/KITS/TRAYS/PACK) IMPLANT

## 2016-11-08 NOTE — Op Note (Signed)
OPERATIVE REPORT    DATE OF PROCEDURE:  11/08/2016       PREOPERATIVE DIAGNOSIS:  LEFT HIP OSTEOARTHRITIS, RETAINED HARDWARE                                                          POSTOPERATIVE DIAGNOSIS:  Left hip osteoarthritis, retained hardware                                                           PROCEDURE: Anterior L total hip arthroplasty using a 48 mm DePuy Pinnacle  Cup, Peabody Energypex Hole Eliminator, 0-degree polyethylene liner, a +5 32 mm ceramic head, a 5 hi Depuy Triloc stem   SURGEON: WJXBJ,YNWGNROWAN,Ladanian Kelter J    ASSISTANT:   Eric K. [employer] PA-C  (present throughout entire procedure and necessary for timely completion of the procedure)   ANESTHESIA: Spinal BLOOD LOSS: 300 FLUID REPLACEMENT: 1600 crystalloid Antibiotic: 2gm ancef Tranexamic Acid: 1gm iv, 2gm topical COMPLICATIONS: none    INDICATIONS FOR PROCEDURE: A 66 y.o. year-old With  LEFT HIP OSTEOARTHRITIS, RETAINED HARDWARE   for 3 years, x-rays show bone-on-bone arthritic changes, and osteophytes. Despite conservative measures with observation, anti-inflammatory medicine, narcotics, use of a cane, has severe unremitting pain and can ambulate only a few blocks before resting. Patient desires elective L total hip arthroplasty to decrease pain and increase function. The risks, benefits, and alternatives were discussed at length including but not limited to the risks of infection, bleeding, nerve injury, stiffness, blood clots, the need for revision surgery, cardiopulmonary complications, among others, and they were willing to proceed. Questions answered     PROCEDURE IN DETAIL: The patient was identified by armband,  received preoperative IV antibiotics [facility], taken to the operating room , appropriate anesthetic monitors  were attached and  anesthesia was induced with the patienton the gurney. The HANA boots were applied to the feet and he was then transferred to the HANA table with a  peroneal post and support underneath the non-operative le, which was locked in 5 lb traction. Theoperative lower extremity was then prepped and draped in the usual sterile fashion from just above the iliac crest to the knee. And a timeout procedure was performed. We then made a 10 cm incision along the interval at the leading edge of the tensor fascia lata of starting at 2 cm lateral to and 2 cm distal to the ASIS. Small bleeders in the skin and subcutaneous tissue identified and cauterized we dissected down to the fascia and made an incision in the fascia allowing us to elevate the fascia of the tensor muscle and exploited the interval between the rectus and the tensor fascia lata. A Hohmann retractor was then placed along the superior neck of the femur and a Cobra retractor along the inferior neck of the femur we teed the capsule starting out at the superior anterior aspect of the acetabulum going distally and made the T along the neck both leaflets of the T were tagged with #2 Ethibond suture. Cobra retractors were then placed along the inferior and superior neck allowing us to  perform a standard neck cut and removed the femoral head with a power corkscrew. We then placed a right angle Hohmann retractor along the anterior aspect of the acetabulum a spiked Cobra in the cotyloid notch and posteriorly a Muelller retractor. We then sequentially reamed up to a 47 mm basket reamer obtaining good coverage in all quadrants, verified by C-arm imaging. Under C-arm control with and hammered into place a 48 mm Pinnacle cup in 45 of abduction and 15 of anteversion. The cup seated nicely and required no supplemental screws. We then placed a central hole Eliminator and a 0 polyethylene liner. The foot was then externally rotated to 110, the HANA elevator was placed around the flare of the greater trochanter and the limb was extended and abducted delivering the proximal femur up into the wound. A medium Hohmann retractor was  placed over the greater trochanter and a Mueller retractor along the posterior femoral neck completing the exposure. We then performed releases superiorly and and inferiorly of the capsule going back to the pirformis fossa superiorly and to the lesser trochanter inferiorly. We then entered the proximal femur with the box cutting offset chisel followed by, a canal sounder, the chili pepper and broaching up to a 5 broach. This seated nicely and we reamed the calcar. A trial reduction was performed with a 5 mm 32 mm head.The limb lengths were excellent the hip was stable in 90 of external rotation. At this point the trial components removed and we hammered into place a # 5 Hi Tri-Lock stem with Gryption coating. This was a Hi offset stem and a + 5 32 mm ceramic ball was then hammered into place the hip was reduced and final C-arm images obtained. The wound was thoroughly irrigated with normal saline solution. We repaired the ant capsule and the tensor fascia lot a with running 0 vicryl suture. the subcutaneous tissue was closed with 2-0 and 3-0 Vicryl suture followed by an Aquacil dressing. At this point the patient was awaken and transferred to hospital gurney without difficulty. The subcutaneous tissue with 0 and 2-0 undyed Vicryl suture and the skin with running  3-0 vicryl subcuticular suture. Aquacil dressing was applied. The patient was then unclamped, rolled supine, awaken extubated and taken to recovery room without difficulty in stable condition.   Gean Birchwood J 11/08/2016, 2:05 PM

## 2016-11-08 NOTE — Anesthesia Preprocedure Evaluation (Addendum)
Anesthesia Evaluation  Patient identified by MRN, date of birth, ID band Patient awake    Reviewed: Allergy & Precautions, NPO status , Patient's Chart, lab work & pertinent test results  History of Anesthesia Complications Negative for: history of anesthetic complications  Airway Mallampati: II  TM Distance: >3 FB Neck ROM: Full    Dental  (+) Dental Advisory Given, Edentulous Upper   Pulmonary COPD, former smoker,    Pulmonary exam normal        Cardiovascular hypertension, +CHF  Normal cardiovascular exam  EF 40%   Neuro/Psych Anxiety negative neurological ROS     GI/Hepatic negative GI ROS, (+) Hepatitis -, C  Endo/Other  negative endocrine ROS  Renal/GU Renal InsufficiencyRenal disease     Musculoskeletal   Abdominal   Peds  Hematology   Anesthesia Other Findings   Reproductive/Obstetrics                            Anesthesia Physical Anesthesia Plan  ASA: III  Anesthesia Plan: MAC and Spinal   Post-op Pain Management:    Induction:   PONV Risk Score and Plan: 2 and Ondansetron and Dexamethasone  Airway Management Planned: Natural Airway and Simple Face Mask  Additional Equipment:   Intra-op Plan:   Post-operative Plan:   Informed Consent: I have reviewed the patients History and Physical, chart, labs and discussed the procedure including the risks, benefits and alternatives for the proposed anesthesia with the patient or authorized representative who has indicated his/her understanding and acceptance.   Dental advisory given  Plan Discussed with: CRNA, Anesthesiologist and Surgeon  Anesthesia Plan Comments:        Anesthesia Quick Evaluation

## 2016-11-08 NOTE — Transfer of Care (Signed)
Immediate Anesthesia Transfer of Care Note  Patient: Todd Cochran  Procedure(s) Performed: Procedure(s): TOTAL HIP ARTHROPLASTY ANTERIOR APPROACH REMOVAL OF HARDWARE (Left)  Patient Location: PACU  Anesthesia Type:MAC and Spinal  Level of Consciousness: awake  Airway & Oxygen Therapy: Patient Spontanous Breathing and Patient connected to face mask oxygen  Post-op Assessment: Report given to RN and Post -op Vital signs reviewed and stable  Post vital signs: Reviewed and stable  Last Vitals:  Vitals:   11/08/16 1034 11/08/16 1041  BP: (!) 161/116 (!) 157/94  Pulse: (!) 101   Resp: 16   Temp: 36.5 C     Last Pain:  Vitals:   11/08/16 1034  TempSrc: Oral      Patients Stated Pain Goal: 2 (24/49/75 3005)  Complications: No apparent anesthesia complications

## 2016-11-08 NOTE — Anesthesia Procedure Notes (Signed)
Procedure Name: MAC Date/Time: 11/08/2016 12:18 PM Performed by: Trixie Deis A Pre-anesthesia Checklist: Patient identified, Emergency Drugs available, Suction available, Patient being monitored and Timeout performed Oxygen Delivery Method: Simple face mask Placement Confirmation: positive ETCO2 Dental Injury: Teeth and Oropharynx as per pre-operative assessment

## 2016-11-08 NOTE — Interval H&P Note (Signed)
History and Physical Interval Note:  11/08/2016 12:14 PM  Todd Cochran  has presented today for surgery, with the diagnosis of LEFT HIP OSTEOARTHRITIS, RETAINED HARDWARE  The various methods of treatment have been discussed with the patient and family. After consideration of risks, benefits and other options for treatment, the patient has consented to  Procedure(s): TOTAL HIP ARTHROPLASTY ANTERIOR APPROACH REMOVAL OF HARDWARE (Left) as a surgical intervention .  The patient's history has been reviewed, patient examined, no change in status, stable for surgery.  I have reviewed the patient's chart and labs.  Questions were answered to the patient's satisfaction.     Nestor LewandowskyOWAN,Freja Faro J

## 2016-11-08 NOTE — Anesthesia Procedure Notes (Signed)
Spinal  Patient location during procedure: OR Start time: 11/08/2016 12:22 PM End time: 11/08/2016 12:28 PM Staffing Anesthesiologist: Heather RobertsSINGER, Todd Cochran Performed: anesthesiologist  Preanesthetic Checklist Completed: patient identified, surgical consent, pre-op evaluation, timeout performed, IV checked, risks and benefits discussed and monitors and equipment checked Spinal Block Patient position: sitting Prep: DuraPrep Patient monitoring: cardiac monitor, continuous pulse ox and blood pressure Approach: midline Location: L2-3 Injection technique: single-shot Needle Needle type: Pencan  Needle gauge: 24 G Needle length: 9 cm Additional Notes Functioning IV was confirmed and monitors were applied. Sterile prep and drape, including hand hygiene and sterile gloves were used. The patient was positioned and the spine was prepped. The skin was anesthetized with lidocaine.  Free flow of clear CSF was obtained prior to injecting local anesthetic into the CSF.  The spinal needle aspirated freely following injection.  The needle was carefully withdrawn.  The patient tolerated the procedure well.

## 2016-11-09 LAB — CBC
HEMATOCRIT: 31.6 % — AB (ref 39.0–52.0)
Hemoglobin: 10.1 g/dL — ABNORMAL LOW (ref 13.0–17.0)
MCH: 26 pg (ref 26.0–34.0)
MCHC: 32 g/dL (ref 30.0–36.0)
MCV: 81.4 fL (ref 78.0–100.0)
PLATELETS: 264 10*3/uL (ref 150–400)
RBC: 3.88 MIL/uL — AB (ref 4.22–5.81)
RDW: 15.4 % (ref 11.5–15.5)
WBC: 15.6 10*3/uL — AB (ref 4.0–10.5)

## 2016-11-09 LAB — BASIC METABOLIC PANEL
ANION GAP: 8 (ref 5–15)
BUN: 20 mg/dL (ref 6–20)
CO2: 25 mmol/L (ref 22–32)
Calcium: 8.6 mg/dL — ABNORMAL LOW (ref 8.9–10.3)
Chloride: 104 mmol/L (ref 101–111)
Creatinine, Ser: 1.16 mg/dL (ref 0.61–1.24)
GFR calc Af Amer: >60 mL/min (ref >60)
GLUCOSE: 138 mg/dL — AB (ref 65–99)
POTASSIUM: 4.3 mmol/L (ref 3.5–5.1)
Sodium: 137 mmol/L (ref 135–145)

## 2016-11-09 MED ORDER — ASPIRIN EC 325 MG PO TBEC: 325.0000 mg | DELAYED_RELEASE_TABLET | Freq: Two times a day (BID) | ORAL | 0 refills | Status: DC

## 2016-11-09 MED ORDER — OXYCODONE-ACETAMINOPHEN 5-325 MG PO TABS: 1.0000 | ORAL_TABLET | ORAL | 0 refills | Status: DC | PRN

## 2016-11-09 NOTE — Discharge Summary (Signed)
Patient ID: Todd Cochran MRN: [id number] DOB/AGE: [birth year] 66 y.o.  Admit date: 11/08/2016 Discharge date: 11/09/2016  Admission Diagnoses:  Principal Problem:   Osteoarthritis of left hip Active Problems:   Primary osteoarthritis of left hip   Discharge Diagnoses:  Same  Past Medical History:  Diagnosis Date  . Anxiety   . Arthritis   . Cardiomyopathy (HCC)   . CHF (congestive heart failure) (HCC)   . CKD (chronic kidney disease), stage II   . COPD (chronic obstructive pulmonary disease) (HCC)   . Dyspnea   . Hepatitis    hep c  . Hypertension   . Pneumonia     Surgeries: Procedure(s): TOTAL HIP ARTHROPLASTY ANTERIOR APPROACH REMOVAL OF HARDWARE on 11/08/2016   Consultants:   Discharged Condition: Improved  Hospital Course: Todd Cochran is an 66 y.o. male who was admitted 11/08/2016 for operative treatment ofOsteoarthritis of left hip. Patient has severe unremitting pain that affects sleep, daily activities, and work/hobbies. After pre-op clearance the patient was taken to the operating room on 11/08/2016 and underwent  Procedure(s): TOTAL HIP ARTHROPLASTY ANTERIOR APPROACH REMOVAL OF HARDWARE.    Patient was given perioperative antibiotics: Anti-infectives    Start     Dose/Rate Route Frequency Ordered Stop   11/08/16 1230  ceFAZolin (ANCEF) IVPB 2g/100 mL premix     2 g 200 mL/hr over 30 Minutes Intravenous To ShortStay Surgical 11/07/16 1211 11/08/16 1248       Patient was given sequential compression devices, early ambulation, and chemoprophylaxis to prevent DVT.  Patient benefited maximally from hospital stay and there were no complications.    Recent vital signs: Patient Vitals for the past 24 hrs:  BP Temp Temp src Pulse Resp SpO2 Height Weight  11/09/16 0500 (!) 144/94 98.2 F (36.8 C) Oral 99 18 98 % - -  11/08/16 2220 (!) 153/93 99.9 F (37.7 C) Oral (!) 110 18 95 % - -  11/08/16 2036 - - - - - 97 % - -  11/08/16 2035 - - - - - 97 % - -   11/08/16 1645 (!) 154/102 97.7 F (36.5 C) Oral 99 18 94 % - -  11/08/16 1630 (!) 159/95 - - 95 20 97 % - -  11/08/16 1615 - - - 93 18 98 % - -  11/08/16 1600 (!) 142/106 - - 93 16 96 % - -  11/08/16 1545 (!) 148/107 - - 92 15 99 % - -  11/08/16 1530 - - - 89 14 99 % - -  11/08/16 1515 (!) 139/104 - - 90 14 99 % - -  11/08/16 1500 (!) 133/94 - - 92 19 97 % - -  11/08/16 1445 (!) 130/95 - - 94 17 96 % - -  11/08/16 1430 - - - 94 10 96 % - -  11/08/16 1424 (!) 128/95 98.6 F (37 C) - 96 18 98 % - -  11/08/16 1111 - - - - - - 5\' 6"  (1.676 m) 66.7 kg (147 lb)  11/08/16 1041 (!) 157/94 - - - - - - -  11/08/16 1034 (!) 161/116 97.7 F (36.5 C) Oral (!) 101 16 96 % - 66.7 kg (147 lb)     Recent laboratory studies:  Recent Labs  11/09/16 0349  WBC 15.6*  HGB 10.1*  HCT 31.6*  PLT 264  NA 137  K 4.3  CL 104  CO2 25  BUN 20  CREATININE 1.16  GLUCOSE 138*  CALCIUM  8.6*     Discharge Medications:   Allergies as of 11/09/2016      Reactions   Ivp Dye [iodinated Diagnostic Agents] Anaphylaxis      Medication List    STOP taking these medications   naproxen sodium 220 MG tablet Commonly known as:  ANAPROX     TAKE these medications   albuterol 108 (90 Base) MCG/ACT inhaler Commonly known as:  PROVENTIL HFA;VENTOLIN HFA Inhale 2 puffs into the lungs every 6 (six) hours as needed for wheezing or shortness of breath.   amitriptyline 50 MG tablet Commonly known as:  ELAVIL Take 50 mg by mouth at bedtime.   aspirin EC 325 MG tablet Take 1 tablet (325 mg total) by mouth 2 (two) times daily. What changed:  medication strength  how much to take  when to take this   atenolol 25 MG tablet Commonly known as:  TENORMIN Take 25 mg by mouth daily.   budesonide-formoterol 160-4.5 MCG/ACT inhaler Commonly known as:  SYMBICORT Inhale 2 puffs into the lungs 2 (two) times daily.   cyclobenzaprine 10 MG tablet Commonly known as:  FLEXERIL Take 10 mg by mouth daily.    docusate sodium 100 MG capsule Commonly known as:  COLACE Take 100-200 mg by mouth every evening.   hydrochlorothiazide 25 MG tablet Commonly known as:  HYDRODIURIL Take 25 mg by mouth daily.   ipratropium-albuterol 0.5-2.5 (3) MG/3ML Soln Commonly known as:  DUONEB Inhale 3 mLs into the lungs 4 (four) times daily.   lip balm Oint Apply 1 application topically as needed for lip care.   lovastatin 20 MG tablet Commonly known as:  MEVACOR Take 20 mg by mouth at bedtime.   nystatin 100000 UNIT/ML suspension Commonly known as:  MYCOSTATIN Use as directed 5 mLs in the mouth or throat 4 (four) times daily as needed. For raw/irritated throat   oxyCODONE 15 MG immediate release tablet Commonly known as:  ROXICODONE Take 15 mg by mouth every 6 (six) hours as needed for pain.   oxyCODONE-acetaminophen 5-325 MG tablet Commonly known as:  ROXICET Take 1 tablet by mouth every 4 (four) hours as needed for severe pain.   tamsulosin 0.4 MG Caps capsule Commonly known as:  FLOMAX Take 0.4 mg by mouth at bedtime.            Durable Medical Equipment        Start     Ordered   11/08/16 1646  DME Walker rolling  Once    Question:  Patient needs a walker to treat with the following condition  Answer:  Status post total hip replacement, left   11/08/16 1645   11/08/16 1646  DME 3 n 1  Once     11/08/16 1645   11/08/16 1646  DME Bedside commode  Once    Question:  Patient needs a bedside commode to treat with the following condition  Answer:  Status post total hip replacement, left   11/08/16 1645      Diagnostic Studies: Dg Chest 2 View  Result Date: 10/27/2016 CLINICAL DATA:  Pre-op chest TOTAL HIP ARTHROPLASTY ANTERIOR APPROACH REMOVAL OF HARDWARE 11/08/2016 Hx of pneumonia COP Hypertension X smoker 2014 EXAM: CHEST - 2 VIEW COMPARISON:  04/26/2016 FINDINGS: Linear scarring in the left mid lung as before. Lungs otherwise clear, hyperinflated. Heart size and mediastinal contours  are within normal limits. No effusion. Old left rib fracture deformities. IMPRESSION: No acute cardiopulmonary disease. Electronically Signed   By: Algis Downs  Todd Cochran M.D.   On: 10/27/2016 13:51   Dg C-arm 1-60 Min  Result Date: 11/08/2016 CLINICAL DATA:  In tear left hip replacement EXAM: DG C-ARM 61-120 MIN; OPERATIVE LEFT HIP WITH PELVIS COMPARISON:  None. FINDINGS: Left hip arthroplasty has a good appearance. Components well position without detectable complication. IMPRESSION: Good appearance following left hip arthroplasty. Electronically Signed   By: Paulina Fusi M.D.   On: 11/08/2016 14:31   Dg Hip Operative Unilat W Or W/o Pelvis Left  Result Date: 11/08/2016 CLINICAL DATA:  In tear left hip replacement EXAM: DG C-ARM 61-120 MIN; OPERATIVE LEFT HIP WITH PELVIS COMPARISON:  None. FINDINGS: Left hip arthroplasty has a good appearance. Components well position without detectable complication. IMPRESSION: Good appearance following left hip arthroplasty. Electronically Signed   By: Paulina Fusi M.D.   On: 11/08/2016 14:31    Disposition: Final discharge disposition not confirmed  Discharge Instructions    Call MD / Call 911    Complete by:  As directed    If you experience chest pain or shortness of breath, CALL 911 and be transported to the hospital emergency room.  If you develope a fever above 101 F, pus (white drainage) or increased drainage or redness at the wound, or calf pain, call your surgeon's office.   Change dressing    Complete by:  As directed    You may change your dressing only if drainage in window is > 40%   Constipation Prevention    Complete by:  As directed    Drink plenty of fluids.  Prune juice may be helpful.  You may use a stool softener, such as Colace (over the counter) 100 mg twice a day.  Use MiraLax (over the counter) for constipation as needed.   Diet - low sodium heart healthy    Complete by:  As directed    Follow the hip precautions as taught in Physical Therapy     Complete by:  As directed    Increase activity slowly as tolerated    Complete by:  As directed       Follow-up Information    Gean Birchwood, MD Follow up in 2 week(s).   Specialty:  Orthopedic Surgery Contact information: [address] [place] [address] [phone]            Signed: Nestor Lewandowsky 11/09/2016, 7:24 AM

## 2016-11-09 NOTE — Progress Notes (Signed)
Pt discharge education and instructions completed with pt and significant other at bedside. Both voices understanding and denies any questions. Pt IV removed; surgical incision dsg remains clean, dry and intact with no stain or active drainage noted. Pt discharge home with partner to transport him home. Pt handed his prescription for Roxicet. Pt transported off unit via wheelchair with partner and belongings to the side. Dionne BucyP. Amo Terron Merfeld RN

## 2016-11-09 NOTE — Progress Notes (Signed)
Physical Therapy Treatment Patient Details Name: Todd Cochran MRN: [id number] DOB: [birth year] Today's Date: 11/09/2016    History of Present Illness 66 yo admitted for Lt THA. PMhx: TKA, arthritis, cardiomyopathy, CHF, CKD, COPD on home O2, Hep C, HTN    PT Comments    Pt continues to mobilize well without physical assist. Pt with 2/4 on DOE scale after ambulating entire until and SpO2 decreased to 87% on 2 L O2. Pt instructed in deep breathing and SpO2 increased to 94%. Pt tolerated standing exercises well and shortness of breath continues to be pt's limitation to mobility. Will continue to follow for mobilization and increased activity tolerance for returning to PLOF and safe d/c.   Vitals:  Pre ambulation on 2 L O2: SpO2 96 HR 97  During ambulation with 2 L O2: SpO2 ranged from 94 down to 87 and HR 114 Post ambulation with 2 L O2: SpO2 94 and HR 111   Follow Up Recommendations  DC plan and follow up therapy as arranged by surgeon     Equipment Recommendations  None recommended by PT    Recommendations for Other Services       Precautions / Restrictions Precautions Precautions: Fall Restrictions Weight Bearing Restrictions: Yes LLE Weight Bearing: Weight bearing as tolerated    Mobility  Bed Mobility Overal bed mobility: Modified Independent             General bed mobility comments: in chair on arrival, bed end of session  Transfers Overall transfer level: Needs assistance   Transfers: Sit to/from Stand Sit to Stand: Supervision         General transfer comment: cues for hand placement  Ambulation/Gait Ambulation/Gait assistance: Supervision Ambulation Distance (Feet): 300 Feet Assistive device: 4-wheeled walker Gait Pattern/deviations: Decreased stride length;Step-through pattern;Trunk flexed   Gait velocity interpretation: Below normal speed for age/gender General Gait Details: cues for posture and proximity to rollator   Stairs            Wheelchair Mobility    Modified Rankin (Stroke Patients Only)       Balance Overall balance assessment: Needs assistance Sitting-balance support: No upper extremity supported;Feet unsupported Sitting balance-Leahy Scale: Good Sitting balance - Comments: Able to sit EOB without support   Standing balance support: Bilateral upper extremity supported;During functional activity Standing balance-Leahy Scale: Fair Standing balance comment: Relies on Bil UE support during standing functional activity; however pt stable                             Cognition Arousal/Alertness: Awake/alert Behavior During Therapy: WFL for tasks assessed/performed Overall Cognitive Status: Within Functional Limits for tasks assessed                                        Exercises Total Joint Exercises Hip ABduction/ADduction: AROM;Left;Standing;10 reps Knee Flexion: AROM;Left;Standing;10 reps Marching in Standing: AROM;Left;10 reps;Standing Standing Hip Extension: AROM;Left;Standing;10 reps    General Comments        Pertinent Vitals/Pain Pain Assessment: 0-10 Pain Score: 7  Pain Location: left hip Pain Descriptors / Indicators: Sore Pain Intervention(s): Limited activity within patient's tolerance;Monitored during session;Repositioned    Home Living                      Prior Function  PT Goals (current goals can now be found in the care plan section) Acute Rehab PT Goals Patient Stated Goal: return home PT Goal Formulation: With patient Time For Goal Achievement: 11/23/16 Potential to Achieve Goals: Good Progress towards PT goals: Progressing toward goals    Frequency    7X/week      PT Plan Current plan remains appropriate    Co-evaluation              AM-PAC PT "6 Clicks" Daily Activity  Outcome Measure  Difficulty turning over in bed (including adjusting bedclothes, sheets and blankets)?: A Little Difficulty  moving from lying on back to sitting on the side of the bed? : A Little Difficulty sitting down on and standing up from a chair with arms (e.g., wheelchair, bedside commode, etc,.)?: A Little Help needed moving to and from a bed to chair (including a wheelchair)?: None Help needed walking in hospital room?: A Little Help needed climbing 3-5 steps with a railing? : A Little 6 Click Score: 19    End of Session Equipment Utilized During Treatment: Gait belt Activity Tolerance: Patient tolerated treatment well Patient left: in bed;with call bell/phone within reach;with family/visitor present;with bed alarm set Nurse Communication: Mobility status PT Visit Diagnosis: Difficulty in walking, not elsewhere classified (R26.2);Pain;Muscle weakness (generalized) (M62.81) Pain - Right/Left: Left Pain - part of body: Hip     Time: 1140-1200 PT Time Calculation (min) (ACUTE ONLY): 20 min  Charges:  $Gait Training: 8-22 mins                    G Codes:       Arneta ClicheVictoria Adra Shepler, SPT Acute Rehab [phone]    Arneta ClicheVictoria Jimi Giza 11/09/2016, 1:17 PM

## 2016-11-09 NOTE — Plan of Care (Signed)
Problem: Safety: Goal: Ability to remain free from injury will improve Outcome: Progressing Call light and Patient belongings in reach. Patient calls for assistance when needed.

## 2016-11-09 NOTE — Discharge Instructions (Signed)

## 2016-11-09 NOTE — Evaluation (Signed)
Physical Therapy Evaluation Patient Details Name: Todd ShuttersVester D Bera MRN: [id number] DOB: 06/04/1950 Today's Date: 11/09/2016   History of Present Illness  66 yo admitted for Lt THA. PMhx: TKA, arthritis, cardiomyopathy, CHF, CKD, COPD on home O2, Hep C, HTN  Clinical Impression  Pt eager to mobilize and required max VCs for slowing down to reduce impulsive behavior with transfers. Pt required no physical assistance with transfers or gait; however safety still remains a concern during functional activities d/t impulsive transfers. Pt also became SOB during ambulation, after which significant other mentioned the patient was on 2L continuous O2 at home PTA. Pt's SpO2 decreased below 90 during ambulation (see vitals below) and increased post ambulation after returned to 1.5L O2 via nasal cannula. Pt presents with deficits listed in PT problem list below and will benefit from continued acute therapy for mobilization, safety awareness and strengthening to safely d/c and return to PLOF.   Vitals: Pre ambulation on RA: 95% SpO2 and HR 103 Post ambulation on RA: 83% SpO2 and HR 119 Post ambulation on 1.5 L O2: 95% SpO2 and HR 96    Follow Up Recommendations DC plan and follow up therapy as arranged by surgeon    Equipment Recommendations  None recommended by PT    Recommendations for Other Services       Precautions / Restrictions Precautions Precautions: Fall Restrictions LLE Weight Bearing: Weight bearing as tolerated      Mobility  Bed Mobility Overal bed mobility: Modified Independent                Transfers Overall transfer level: Needs assistance   Transfers: Sit to/from Stand Sit to Stand: Supervision            Ambulation/Gait Ambulation/Gait assistance: Supervision Ambulation Distance (Feet): 200 Feet Assistive device: 4-wheeled walker Gait Pattern/deviations: Decreased stride length;Step-through pattern   Gait velocity interpretation: Below normal speed for  age/gender    Stairs            Wheelchair Mobility    Modified Rankin (Stroke Patients Only)       Balance Overall balance assessment: Needs assistance Sitting-balance support: No upper extremity supported;Feet unsupported Sitting balance-Leahy Scale: Good Sitting balance - Comments: Able to sit EOB without support   Standing balance support: Bilateral upper extremity supported;During functional activity Standing balance-Leahy Scale: Poor Standing balance comment: Relies on Bil UE support during standing functional activity                              Pertinent Vitals/Pain Pain Assessment: 0-10 Pain Score: 2  Pain Descriptors / Indicators: Sore Pain Intervention(s): Monitored during session;Limited activity within patient's tolerance;Repositioned    Home Living Family/patient expects to be discharged to:: Private residence Living Arrangements: Spouse/significant other Available Help at Discharge: Family;Available 24 hours/day Type of Home: House Home Access: Level entry     Home Layout: One level Home Equipment: Walker - 4 wheels;Bedside commode;Cane - single point;Walker - 2 wheels      Prior Function Level of Independence: Independent with assistive device(s)         Comments: walks with cane     Hand Dominance        Extremity/Trunk Assessment                Communication   Communication: No difficulties  Cognition Arousal/Alertness: Awake/alert Behavior During Therapy: WFL for tasks assessed/performed;Impulsive (Impulsive with transfers) Overall Cognitive Status: Within Functional Limits  for tasks assessed                                        General Comments      Exercises Total Joint Exercises Hip ABduction/ADduction: AROM;Seated;Left;10 reps Long Arc Quad: AROM;Left;Seated;10 reps Marching in Standing: AROM;Left;Seated;10 reps   Assessment/Plan    PT Assessment Patient needs continued PT  services  PT Problem List Decreased strength;Decreased mobility;Decreased activity tolerance;Cardiopulmonary status limiting activity;Decreased balance;Decreased knowledge of use of DME;Pain       PT Treatment Interventions Gait training;Therapeutic exercise;Patient/family education;DME instruction;Therapeutic activities;Functional mobility training    PT Goals (Current goals can be found in the Care Plan section)  Acute Rehab PT Goals Patient Stated Goal: return home PT Goal Formulation: With patient Time For Goal Achievement: 11/16/16 Potential to Achieve Goals: Good    Frequency 7X/week   Barriers to discharge        Co-evaluation               AM-PAC PT "6 Clicks" Daily Activity  Outcome Measure Difficulty turning over in bed (including adjusting bedclothes, sheets and blankets)?: A Little Difficulty moving from lying on back to sitting on the side of the bed? : A Little Difficulty sitting down on and standing up from a chair with arms (e.g., wheelchair, bedside commode, etc,.)?: A Little Help needed moving to and from a bed to chair (including a wheelchair)?: A Little Help needed walking in hospital room?: A Little Help needed climbing 3-5 steps with a railing? : A Little 6 Click Score: 18    End of Session Equipment Utilized During Treatment: Gait belt Activity Tolerance: Patient tolerated treatment well Patient left: in chair;with call bell/phone within reach;with family/visitor present;with chair alarm set Nurse Communication: Mobility status;Weight bearing status PT Visit Diagnosis: Difficulty in walking, not elsewhere classified (R26.2);Pain;Muscle weakness (generalized) (M62.81) Pain - Right/Left: Left Pain - part of body: Hip    Time: 0732-0759 PT Time Calculation (min) (ACUTE ONLY): 27 min   Charges:   PT Evaluation $PT Eval Moderate Complexity: 1 Procedure PT Treatments $Gait Training: 8-22 mins   PT G CodesArneta Cliche,  SPT Acute Rehab [phone] 11/09/2016, 8:46 AM

## 2016-11-09 NOTE — Anesthesia Postprocedure Evaluation (Signed)
Anesthesia Post Note  Patient: Todd Cochran  Procedure(s) Performed: Procedure(s) (LRB): TOTAL HIP ARTHROPLASTY ANTERIOR APPROACH REMOVAL OF HARDWARE (Left)     Patient location during evaluation: PACU Anesthesia Type: MAC and Spinal Level of consciousness: oriented and awake and alert Pain management: pain level controlled Vital Signs Assessment: post-procedure vital signs reviewed and stable Respiratory status: spontaneous breathing and respiratory function stable Cardiovascular status: blood pressure returned to baseline and stable Postop Assessment: no headache and no backache Anesthetic complications: no    Last Vitals:  Vitals:   11/09/16 1211 11/09/16 1249  BP: (!) 134/93   Pulse: 98 97  Resp: (!) 21   Temp: 36.6 C     Last Pain:  Vitals:   11/09/16 1357  TempSrc:   PainSc: [place]

## 2016-11-09 NOTE — Progress Notes (Signed)
PATIENT ID: Todd Cochran  MRN: [id number]  DOB/AGE:  Nov 08, 1950 / 66 y.o.  1 Day Post-Op Procedure(s) (LRB): TOTAL HIP ARTHROPLASTY ANTERIOR APPROACH REMOVAL OF HARDWARE (Left)    PROGRESS NOTE Subjective: Patient is alert, oriented, no Nausea, no Vomiting, yes passing gas, . Taking PO well. Denies SOB, Chest or Calf Pain. Using Incentive Spirometer, PAS in place. Ambulate WBAT Patient reports pain as  2/10  .    Objective: Vital signs in last 24 hours: Vitals:   11/08/16 2035 11/08/16 2036 11/08/16 2220 11/09/16 0500  BP:   (!) 153/93 (!) 144/94  Pulse:   (!) 110 99  Resp:   18 18  Temp:   99.9 F (37.7 C) 98.2 F (36.8 C)  TempSrc:   Oral Oral  SpO2: 97% 97% 95% 98%  Weight:      Height:          Intake/Output from previous day: I/O last 3 completed shifts: In: 1740 [P.O.:120; I.V.:1260; IV Piggyback:360] Out: 1950 [Urine:1500; Blood:450]   Intake/Output this shift: No intake/output data recorded.   LABORATORY DATA:  Recent Labs  11/09/16 0349  WBC 15.6*  HGB 10.1*  HCT 31.6*  PLT 264  NA 137  K 4.3  CL 104  CO2 25  BUN 20  CREATININE 1.16  GLUCOSE 138*  CALCIUM 8.6*    Examination: Neurologically intact ABD soft Neurovascular intact Sensation intact distally Intact pulses distally Dorsiflexion/Plantar flexion intact Incision: dressing C/D/I No cellulitis present Compartment soft} XR AP&Lat of hip shows well placed\fixed THA  Assessment:   1 Day Post-Op Procedure(s) (LRB): TOTAL HIP ARTHROPLASTY ANTERIOR APPROACH REMOVAL OF HARDWARE (Left) ADDITIONAL DIAGNOSIS:  Expected Acute Blood Loss Anemia, Hypertension, COPD, Hep C, Dialated cardiomyopathy hx  Plan: PT/OT WBAT, THA  DVT Prophylaxis: SCDx72 hrs, ASA 325 mg BID x 2 weeks  DISCHARGE PLAN: Home, today if passes PT  DISCHARGE NEEDS: HHPT, Walker and 3-in-1 comode seatPatient ID: Todd Cochran, male   DOB: Nov 08, 1950, 66 y.o.   MRN: [id number]

## 2016-11-10 ENCOUNTER — Encounter (HOSPITAL_COMMUNITY): Payer: Self-pay | Admitting: Orthopedic Surgery

## 2017-04-04 DIAGNOSIS — R0602 Shortness of breath: Secondary | ICD-10-CM

## 2018-12-08 IMAGING — CR DG CHEST 2V
2 series · 2 of 2 positions shown · non-contrast
Comparison: 04/26/2016

CLINICAL DATA: Pre-op chest TOTAL HIP ARTHROPLASTY ANTERIOR
APPROACH REMOVAL OF HARDWARE 11/08/2016 Hx of pneumonia COP
Hypertension X smoker 9420

EXAM:
CHEST - 2 VIEW

[w chest pa]
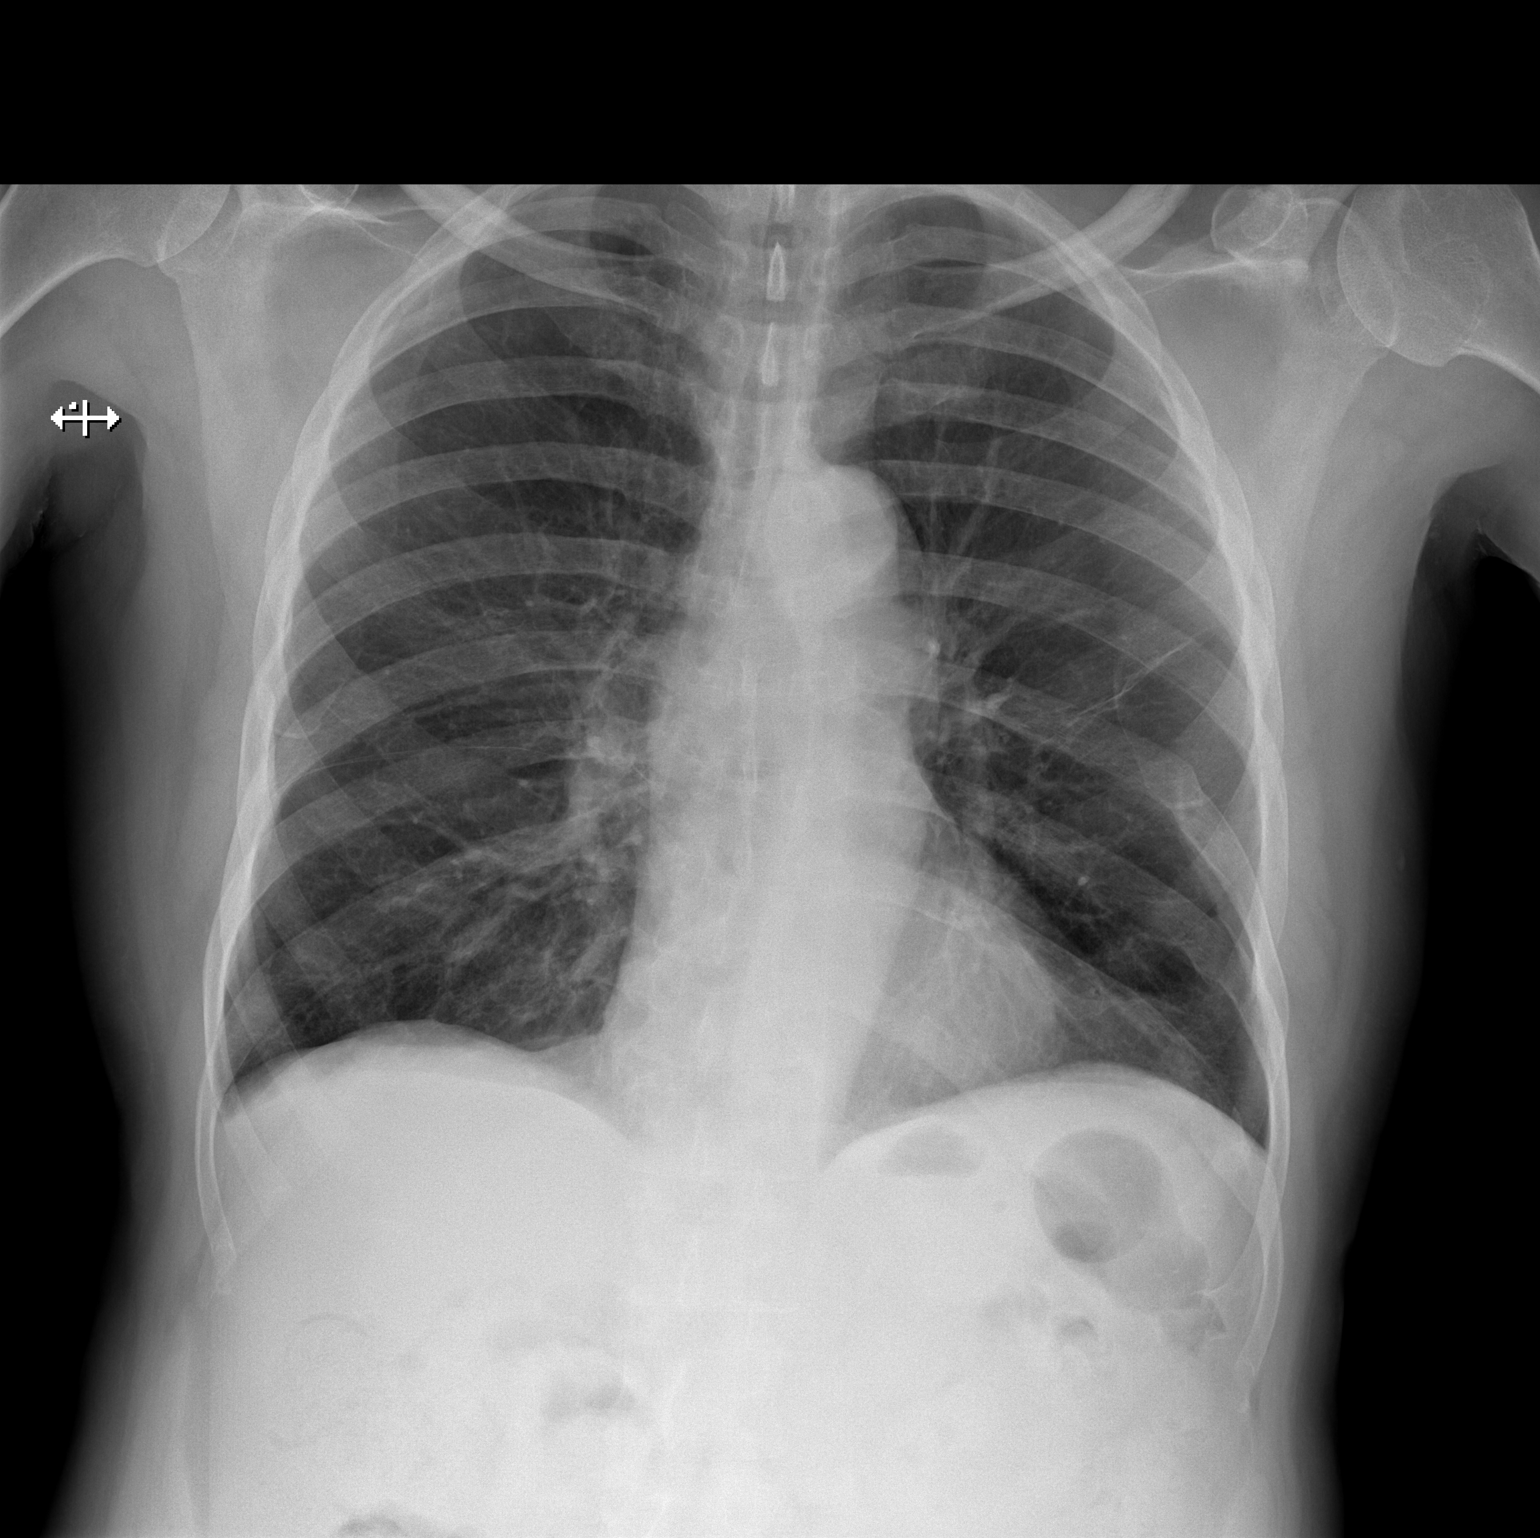

[w chest lat]
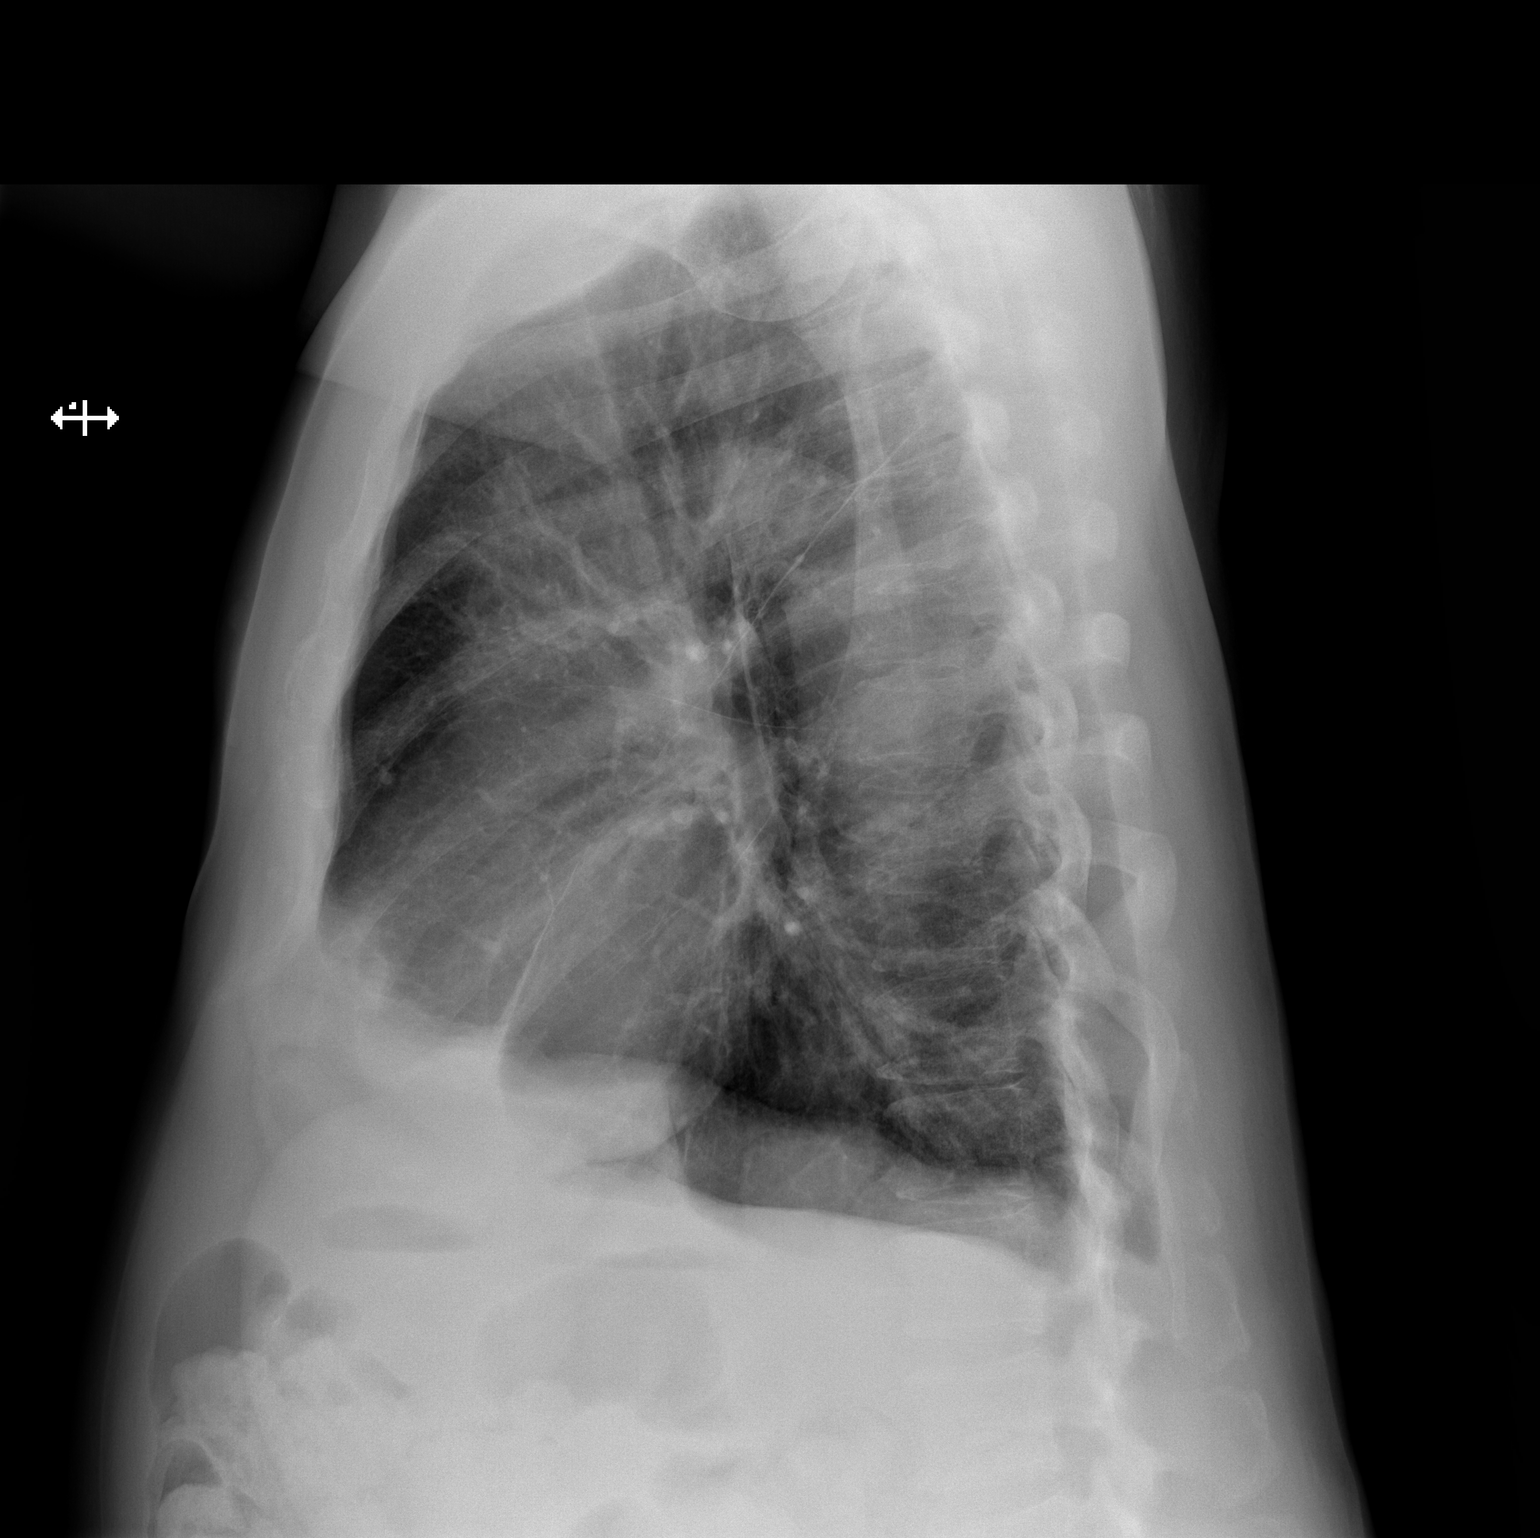

[2 of 2 positions shown; findings below may reference images not displayed]

FINDINGS: Linear scarring in the left mid lung as before. Lungs otherwise
clear, hyperinflated.

Heart size and mediastinal contours are within normal limits.

No effusion.

Old left rib fracture deformities.
IMPRESSION: No acute cardiopulmonary disease.

## 2019-08-07 ENCOUNTER — Other Ambulatory Visit: Payer: Self-pay | Admitting: Orthopedic Surgery

## 2019-09-03 ENCOUNTER — Encounter (HOSPITAL_COMMUNITY): Payer: Self-pay

## 2019-09-03 NOTE — Patient Instructions (Addendum)
DUE TO COVID-19 ONLY ONE VISITOR ARE ALLOWED TO COME WITH YOU AND STAY IN THE WAITING ROOM ONLY DURING PRE OP AND PROCEDURE. THEN TWO VISITORS MAY VISIT WITH YOU IN YOUR PRIVATE ROOM DURING VISITING HOURS ONLY!!   COVID SWAB TESTING MUST BE COMPLETED ON:  Thursday, Sep 11, 2019 at 8:45AM   [address], [facility] (Must self quarantine after testing. Follow instructions on handout.)             Your procedure is scheduled on: Monday, Sep 15, 2019   Report to [facility] Main  Entrance   Report to Short Stay at 5:30 AM   [facility])   Call this number if you have problems the morning of surgery [phone]   Do not eat food :After Midnight.   May have liquids until 4:15 AM day of surgery   CLEAR LIQUID DIET  Foods Allowed                                                                     Foods Excluded  Water, Black Coffee and tea, regular and decaf                             liquids that you cannot  Plain Jell-O in any flavor  (No red)                                           see through such as: Fruit ices (not with fruit pulp)                                     milk, soups, orange juice  Iced Popsicles (No red)                                    All solid food Carbonated beverages, regular and diet                                    Apple juices Sports drinks like Gatorade (No red) Lightly seasoned clear broth or consume(fat free) Sugar, honey syrup  Sample Menu Breakfast                                Lunch                                     Supper Cranberry juice                    Beef broth  Chicken broth Jell-O                                     Grape juice                           Apple juice Coffee or tea                        Jell-O                                      Popsicle                                                Coffee or tea                         Coffee or tea   Complete one Ensure drink the morning of surgery at 4:15 AM the day of surgery.   Oral Hygiene is also important to reduce your risk of infection.                                    Remember - BRUSH YOUR TEETH THE MORNING OF SURGERY WITH YOUR REGULAR TOOTHPASTE   Do NOT smoke after Midnight   Take these medicines the morning of surgery with A SIP OF WATER: Atenolol, Pantoprazole, Rosuvastatin, Tamsulosin,    Use Asthma inhaler morning of surgery   Bring Asthma Inhaler day of surgery                               You may not have any metal on your body including hair pins, jewelry, and body piercings             Do not wear make-up, lotions, powders, perfumes/cologne, or deodorant             Do not wear nail polish.  Do not shave  48 hours prior to surgery.               Do not bring valuables to the hospital. [facility] IS NOT             RESPONSIBLE   FOR VALUABLES.   Contacts, dentures or bridgework may not be worn into surgery.   Bring small overnight bag day of surgery.    Patients discharged the day of surgery will not be allowed to drive home.   Special Instructions: Bring a copy of your healthcare power of attorney and living will documents         the day of surgery if you haven't scanned them in before.              Please read over the following fact sheets you were given: IF YOU HAVE QUESTIONS ABOUT YOUR PRE OP INSTRUCTIONS PLEASE CALL [phone]   [facility] - Preparing for Surgery Before surgery, you can play an important role.  Because skin is not sterile, your skin needs  to be as free of germs as possible.  You can reduce the number of germs on your skin by washing with CHG (chlorahexidine gluconate) soap before surgery.  CHG is an antiseptic cleaner which kills germs and bonds with the skin to continue killing germs even after washing. Please DO NOT use if you have an allergy to CHG or antibacterial soaps.  If your skin becomes  reddened/irritated stop using the CHG and inform your nurse when you arrive at Short Stay. Do not shave (including legs and underarms) for at least 48 hours prior to the first CHG shower.  You may shave your face/neck.  Please follow these instructions carefully:  1.  Shower with CHG Soap the night before surgery and the  morning of surgery.  2.  If you choose to wash your hair, wash your hair first as usual with your normal  shampoo.  3.  After you shampoo, rinse your hair and body thoroughly to remove the shampoo.                             4.  Use CHG as you would any other liquid soap.  You can apply chg directly to the skin and wash.  Gently with a scrungie or clean washcloth.  5.  Apply the CHG Soap to your body ONLY FROM THE NECK DOWN.   Do   not use on face/ open                           Wound or open sores. Avoid contact with eyes, ears mouth and   genitals (private parts).                       Wash face,  Genitals (private parts) with your normal soap.             6.  Wash thoroughly, paying special attention to the area where your    surgery  will be performed.  7.  Thoroughly rinse your body with warm water from the neck down.  8.  DO NOT shower/wash with your normal soap after using and rinsing off the CHG Soap.                9.  Pat yourself dry with a clean towel.            10.  Wear clean pajamas.            11.  Place clean sheets on your bed the night of your first shower and do not  sleep with pets. Day of Surgery : Do not apply any lotions/deodorants the morning of surgery.  Please wear clean clothes to the hospital/surgery center.  FAILURE TO FOLLOW THESE INSTRUCTIONS MAY RESULT IN THE CANCELLATION OF YOUR SURGERY  PATIENT SIGNATURE_________________________________  NURSE SIGNATURE__________________________________  ________________________________________________________________________   Todd Cochran  An incentive spirometer is a tool that can help  keep your lungs clear and active. This tool measures how well you are filling your lungs with each breath. Taking long deep breaths may help reverse or decrease the chance of developing breathing (pulmonary) problems (especially infection) following:  A long period of time when you are unable to move or be active. BEFORE THE PROCEDURE   If the spirometer includes an indicator to show your best effort, your nurse or respiratory therapist will set it to a desired  goal.  If possible, sit up straight or lean slightly forward. Try not to slouch.  Hold the incentive spirometer in an upright position. INSTRUCTIONS FOR USE  1. Sit on the edge of your bed if possible, or sit up as far as you can in bed or on a chair. 2. Hold the incentive spirometer in an upright position. 3. Breathe out normally. 4. Place the mouthpiece in your mouth and seal your lips tightly around it. 5. Breathe in slowly and as deeply as possible, raising the piston or the ball toward the top of the column. 6. Hold your breath for 3-5 seconds or for as long as possible. Allow the piston or ball to fall to the bottom of the column. 7. Remove the mouthpiece from your mouth and breathe out normally. 8. Rest for a few seconds and repeat Steps 1 through 7 at least 10 times every 1-2 hours when you are awake. Take your time and take a few normal breaths between deep breaths. 9. The spirometer may include an indicator to show your best effort. Use the indicator as a goal to work toward during each repetition. 10. After each set of 10 deep breaths, practice coughing to be sure your lungs are clear. If you have an incision (the cut made at the time of surgery), support your incision when coughing by placing a pillow or rolled up towels firmly against it. Once you are able to get out of bed, walk around indoors and cough well. You may stop using the incentive spirometer when instructed by your caregiver.  RISKS AND COMPLICATIONS  Take your  time so you do not get dizzy or light-headed.  If you are in pain, you may need to take or ask for pain medication before doing incentive spirometry. It is harder to take a deep breath if you are having pain. AFTER USE  Rest and breathe slowly and easily.  It can be helpful to keep track of a log of your progress. Your caregiver can provide you with a simple table to help with this. If you are using the spirometer at home, follow these instructions: SEEK MEDICAL CARE IF:   You are having difficultly using the spirometer.  You have trouble using the spirometer as often as instructed.  Your pain medication is not giving enough relief while using the spirometer.  You develop fever of 100.5 F (38.1 C) or higher. SEEK IMMEDIATE MEDICAL CARE IF:   You cough up bloody sputum that had not been present before.  You develop fever of 102 F (38.9 C) or greater.  You develop worsening pain at or near the incision site. MAKE SURE YOU:   Understand these instructions.  Will watch your condition.  Will get help right away if you are not doing well or get worse. Document Released: 08/21/2006 Document Revised: 07/03/2011 Document Reviewed: 10/22/2006 ExitCare Patient Information 2014 ExitCare, [place].   ________________________________________________________________________  WHAT IS A BLOOD TRANSFUSION? Blood Transfusion Information  A transfusion is the replacement of blood or some of its parts. Blood is made up of multiple cells which provide different functions.  Red blood cells carry oxygen and are used for blood loss replacement.  White blood cells fight against infection.  Platelets control bleeding.  Plasma helps clot blood.  Other blood products are available for specialized needs, such as hemophilia or other clotting disorders. BEFORE THE TRANSFUSION  Who gives blood for transfusions?   Healthy volunteers who are fully evaluated to make sure their blood  is safe. This  is blood bank blood. Transfusion therapy is the safest it has ever been in the practice of medicine. Before blood is taken from a donor, a complete history is taken to make sure that person has no history of diseases nor engages in risky social behavior (examples are intravenous drug use or sexual activity with multiple partners). The donor's travel history is screened to minimize risk of transmitting infections, such as malaria. The donated blood is tested for signs of infectious diseases, such as HIV and hepatitis. The blood is then tested to be sure it is compatible with you in order to minimize the chance of a transfusion reaction. If you or a relative donates blood, this is often done in anticipation of surgery and is not appropriate for emergency situations. It takes many days to process the donated blood. RISKS AND COMPLICATIONS Although transfusion therapy is very safe and saves many lives, the main dangers of transfusion include:   Getting an infectious disease.  Developing a transfusion reaction. This is an allergic reaction to something in the blood you were given. Every precaution is taken to prevent this. The decision to have a blood transfusion has been considered carefully by your caregiver before blood is given. Blood is not given unless the benefits outweigh the risks. AFTER THE TRANSFUSION  Right after receiving a blood transfusion, you will usually feel much better and more energetic. This is especially true if your red blood cells have gotten low (anemic). The transfusion raises the level of the red blood cells which carry oxygen, and this usually causes an energy increase.  The nurse administering the transfusion will monitor you carefully for complications. HOME CARE INSTRUCTIONS  No special instructions are needed after a transfusion. You may find your energy is better. Speak with your caregiver about any limitations on activity for underlying diseases you may have. SEEK  MEDICAL CARE IF:   Your condition is not improving after your transfusion.  You develop redness or irritation at the intravenous (IV) site. SEEK IMMEDIATE MEDICAL CARE IF:  Any of the following symptoms occur over the next 12 hours:  Shaking chills.  You have a temperature by mouth above 102 F (38.9 C), not controlled by medicine.  Chest, back, or muscle pain.  People around you feel you are not acting correctly or are confused.  Shortness of breath or difficulty breathing.  Dizziness and fainting.  You get a rash or develop hives.  You have a decrease in urine output.  Your urine turns a dark color or changes to pink, red, or brown. Any of the following symptoms occur over the next 10 days:  You have a temperature by mouth above 102 F (38.9 C), not controlled by medicine.  Shortness of breath.  Weakness after normal activity.  The white part of the eye turns yellow (jaundice).  You have a decrease in the amount of urine or are urinating less often.  Your urine turns a dark color or changes to pink, red, or brown. Document Released: 04/07/2000 Document Revised: 07/03/2011 Document Reviewed: 11/25/2007 [facility] Patient Information 2014 [place], [place].  _______________________________________________________________________

## 2019-09-08 ENCOUNTER — Encounter (HOSPITAL_COMMUNITY): Payer: Self-pay

## 2019-09-08 ENCOUNTER — Other Ambulatory Visit: Payer: Self-pay

## 2019-09-08 ENCOUNTER — Encounter (HOSPITAL_COMMUNITY)
Admission: RE | Admit: 2019-09-08 | Discharge: 2019-09-08 | Disposition: A | Discharge status: Home / Self Care | Payer: 59 | Source: Ambulatory Visit | Attending: Orthopedic Surgery | Admitting: Orthopedic Surgery

## 2019-09-08 HISTORY — DX: Unspecified viral hepatitis C without hepatic coma: B19.20

## 2019-09-08 HISTORY — DX: Gastro-esophageal reflux disease without esophagitis: K21.9

## 2019-09-08 HISTORY — DX: Other pulmonary embolism without acute cor pulmonale: I26.99

## 2019-09-08 HISTORY — DX: Personal history of diseases of the blood and blood-forming organs and certain disorders involving the immune mechanism: Z86.2

## 2019-09-08 HISTORY — DX: Unspecified cirrhosis of liver: K74.60

## 2019-09-08 HISTORY — DX: Vitamin D deficiency, unspecified: E55.9

## 2019-09-08 HISTORY — DX: Spinal stenosis, site unspecified: M48.00

## 2019-09-08 NOTE — Progress Notes (Signed)
Has completed COVID 19 vaccine series  PCP - Dr. Ginette Otto Cardiologist - N/A  Chest x-ray - 09/11/19 in epic EKG - 09/11/19 in epic Stress Test - N/A ECHO - greater than 2 years Cardiac Cath - greater than 2 years  Sleep Study - Yes CPAP - NO SLEEP APNEA  Fasting Blood Sugar - N/A Checks Blood Sugar __N/A___ times a day  Blood Thinner Instructions: N/A Aspirin Instructions: Instructed Stephanie to contact Dr. Turner Daniels to get instructions Last Dose: currently still taking  Anesthesia review: CPOD 3 L [place] continous, HTN  Patient denies shortness of breath, fever, cough and chest pain at PAT appointment   Patient verbalized understanding of instructions that were given to them at the PAT appointment. Patient was also instructed that they will need to review over the PAT instructions again at home before surgery.

## 2019-09-09 ENCOUNTER — Encounter (HOSPITAL_COMMUNITY): Payer: 59

## 2019-09-11 ENCOUNTER — Ambulatory Visit (HOSPITAL_COMMUNITY)
Admission: RE | Admit: 2019-09-11 | Discharge: 2019-09-11 | Disposition: A | Discharge status: Home / Self Care | Payer: 59 | Source: Ambulatory Visit | Attending: Orthopedic Surgery | Admitting: Orthopedic Surgery

## 2019-09-11 ENCOUNTER — Other Ambulatory Visit: Payer: Self-pay

## 2019-09-11 ENCOUNTER — Other Ambulatory Visit (HOSPITAL_COMMUNITY)
Admission: RE | Admit: 2019-09-11 | Discharge: 2019-09-11 | Disposition: A | Discharge status: Home / Self Care | Payer: 59 | Source: Ambulatory Visit | Attending: Orthopedic Surgery | Admitting: Orthopedic Surgery

## 2019-09-11 ENCOUNTER — Encounter (HOSPITAL_COMMUNITY)
Admission: RE | Admit: 2019-09-11 | Discharge: 2019-09-11 | Disposition: A | Discharge status: Home / Self Care | Payer: 59 | Source: Ambulatory Visit | Attending: Orthopedic Surgery | Admitting: Orthopedic Surgery

## 2019-09-11 DIAGNOSIS — Z9981 Dependence on supplemental oxygen: Secondary | ICD-10-CM | POA: Insufficient documentation

## 2019-09-11 DIAGNOSIS — R9431 Abnormal electrocardiogram [ECG] [EKG]: Secondary | ICD-10-CM | POA: Diagnosis not present

## 2019-09-11 DIAGNOSIS — M1611 Unilateral primary osteoarthritis, right hip: Secondary | ICD-10-CM | POA: Insufficient documentation

## 2019-09-11 DIAGNOSIS — Z20822 Contact with and (suspected) exposure to covid-19: Secondary | ICD-10-CM | POA: Insufficient documentation

## 2019-09-11 DIAGNOSIS — Z01818 Encounter for other preprocedural examination: Secondary | ICD-10-CM | POA: Diagnosis present

## 2019-09-11 DIAGNOSIS — Z7982 Long term (current) use of aspirin: Secondary | ICD-10-CM | POA: Insufficient documentation

## 2019-09-11 DIAGNOSIS — E559 Vitamin D deficiency, unspecified: Secondary | ICD-10-CM | POA: Diagnosis not present

## 2019-09-11 DIAGNOSIS — Z86711 Personal history of pulmonary embolism: Secondary | ICD-10-CM | POA: Insufficient documentation

## 2019-09-11 DIAGNOSIS — I10 Essential (primary) hypertension: Secondary | ICD-10-CM | POA: Diagnosis not present

## 2019-09-11 DIAGNOSIS — K219 Gastro-esophageal reflux disease without esophagitis: Secondary | ICD-10-CM | POA: Insufficient documentation

## 2019-09-11 DIAGNOSIS — Z79899 Other long term (current) drug therapy: Secondary | ICD-10-CM | POA: Diagnosis not present

## 2019-09-11 DIAGNOSIS — J449 Chronic obstructive pulmonary disease, unspecified: Secondary | ICD-10-CM | POA: Diagnosis not present

## 2019-09-11 DIAGNOSIS — Z87891 Personal history of nicotine dependence: Secondary | ICD-10-CM | POA: Diagnosis not present

## 2019-09-11 DIAGNOSIS — Z8619 Personal history of other infectious and parasitic diseases: Secondary | ICD-10-CM | POA: Insufficient documentation

## 2019-09-11 LAB — BASIC METABOLIC PANEL
Anion gap: 9 (ref 5–15)
BUN: 21 mg/dL (ref 8–23)
CO2: 30 mmol/L (ref 22–32)
Calcium: 8.8 mg/dL — ABNORMAL LOW (ref 8.9–10.3)
Chloride: 102 mmol/L (ref 98–111)
Creatinine, Ser: 1.77 mg/dL — ABNORMAL HIGH (ref 0.61–1.24)
GFR calc Af Amer: 45 mL/min — ABNORMAL LOW (ref >60)
GFR calc non Af Amer: 39 mL/min — ABNORMAL LOW (ref >60)
Glucose, Bld: 88 mg/dL (ref 70–99)
Potassium: 4.8 mmol/L (ref 3.5–5.1)
Sodium: 141 mmol/L (ref 135–145)

## 2019-09-11 LAB — URINALYSIS, ROUTINE W REFLEX MICROSCOPIC
Bilirubin Urine: NEGATIVE
Glucose, UA: NEGATIVE mg/dL
Hgb urine dipstick: NEGATIVE
Ketones, ur: NEGATIVE mg/dL
Nitrite: NEGATIVE
Protein, ur: NEGATIVE mg/dL
Specific Gravity, Urine: 1.026 (ref 1.005–1.030)
pH: 5 (ref 5.0–8.0)

## 2019-09-11 LAB — APTT: aPTT: 40 seconds — ABNORMAL HIGH (ref 24–36)

## 2019-09-11 LAB — CBC WITH DIFFERENTIAL/PLATELET
Abs Immature Granulocytes: 0.08 10*3/uL — ABNORMAL HIGH (ref 0.00–0.07)
Basophils Absolute: 0.1 10*3/uL (ref 0.0–0.1)
Basophils Relative: 0 %
Eosinophils Absolute: 0.6 10*3/uL — ABNORMAL HIGH (ref 0.0–0.5)
Eosinophils Relative: 4 %
HCT: 37.9 % — ABNORMAL LOW (ref 39.0–52.0)
Hemoglobin: 11.7 g/dL — ABNORMAL LOW (ref 13.0–17.0)
Immature Granulocytes: 1 %
Lymphocytes Relative: 15 %
Lymphs Abs: 1.8 10*3/uL (ref 0.7–4.0)
MCH: 27.1 pg (ref 26.0–34.0)
MCHC: 30.9 g/dL (ref 30.0–36.0)
MCV: 87.9 fL (ref 80.0–100.0)
Monocytes Absolute: 0.9 10*3/uL (ref 0.1–1.0)
Monocytes Relative: 7 %
Neutro Abs: 9.3 10*3/uL — ABNORMAL HIGH (ref 1.7–7.7)
Neutrophils Relative %: 73 %
Platelets: 347 10*3/uL (ref 150–400)
RBC: 4.31 MIL/uL (ref 4.22–5.81)
RDW: 15.8 % — ABNORMAL HIGH (ref 11.5–15.5)
WBC: 12.7 10*3/uL — ABNORMAL HIGH (ref 4.0–10.5)
nRBC: 0 % (ref 0.0–0.2)

## 2019-09-11 LAB — SURGICAL PCR SCREEN
MRSA, PCR: NEGATIVE
Staphylococcus aureus: POSITIVE — AB

## 2019-09-11 LAB — PROTIME-INR
INR: 1.1 (ref 0.8–1.2)
Prothrombin Time: 13.8 seconds (ref 11.4–15.2)

## 2019-09-11 LAB — SARS CORONAVIRUS 2 (TAT 6-24 HRS): SARS Coronavirus 2: NEGATIVE

## 2019-09-11 LAB — ABO/RH: ABO/RH(D): A POS

## 2019-09-11 NOTE — Progress Notes (Signed)
PCR results 09/11/19 faxed via epic to Dr. Turner Daniels.

## 2019-09-12 DIAGNOSIS — M1611 Unilateral primary osteoarthritis, right hip: Secondary | ICD-10-CM | POA: Diagnosis present

## 2019-09-12 HISTORY — DX: Unilateral primary osteoarthritis, right hip: M16.11

## 2019-09-12 NOTE — Anesthesia Preprocedure Evaluation (Addendum)
Anesthesia Evaluation  Patient identified by MRN, date of birth, ID band Patient awake    Reviewed: Allergy & Precautions, NPO status , Patient's Chart, lab work & pertinent test results, reviewed documented beta blocker date and time   History of Anesthesia Complications Negative for: history of anesthetic complications  Airway Mallampati: II  TM Distance: >3 FB Neck ROM: Full    Dental  (+) Edentulous Upper, Edentulous Lower   Pulmonary asthma , COPD,  COPD inhaler and oxygen dependent, former smoker,   severe COPD with continuous 3L O2 Last seen by pulmonology 08/26/2019.  Per OV note, "Patient is at hight risk for general surgery due to respiratory issues.  I think that the patient should be given a dose of solumedrol 125mg  IV a few hours before surgery and should also be given a nebulizer treatment an hour before and also after anesthesia.  Patient should be on the lowest oxygen (post surgery) to keep the pulse ox between 90-92%.  Patient may need BiPAP after anesthesia.  I would be glad to assist you and please do not hesitate to call."    Pulmonary exam normal        Cardiovascular hypertension, Pt. on medications and Pt. on home beta blockers Normal cardiovascular exam     Neuro/Psych PSYCHIATRIC DISORDERS Anxiety negative neurological ROS     GI/Hepatic GERD  Medicated and Controlled,(+) Cirrhosis       , Hepatitis -, C  Endo/Other  negative endocrine ROS  Renal/GU negative Renal ROS     Musculoskeletal  (+) Arthritis ,  Spinal stenosis    Abdominal   Peds  Hematology negative hematology ROS (+)  Plt 347k    Anesthesia Other Findings Covid neg 09/11/19  Reproductive/Obstetrics                           Anesthesia Physical Anesthesia Plan  ASA: IV  Anesthesia Plan: Spinal   Post-op Pain Management:    Induction:   PONV Risk Score and Plan: 1 and Treatment may vary due to  age or medical condition and Propofol infusion  Airway Management Planned: Natural Airway and Simple Face Mask  Additional Equipment: None  Intra-op Plan:   Post-operative Plan:   Informed Consent: I have reviewed the patients History and Physical, chart, labs and discussed the procedure including the risks, benefits and alternatives for the proposed anesthesia with the patient or authorized representative who has indicated his/her understanding and acceptance.       Plan Discussed with: CRNA and Anesthesiologist  Anesthesia Plan Comments: (Labs reviewed, platelets acceptable. Discussed risks and benefits of spinal, including spinal/epidural hematoma, infection, failed block, and PDPH. Patient expressed understanding and wished to proceed. )      Anesthesia Quick Evaluation

## 2019-09-12 NOTE — Progress Notes (Addendum)
Anesthesia Chart Review   Case: [id number] Date/Time: 09/15/19 0700   Procedure: TOTAL HIP ARTHROPLASTY ANTERIOR APPROACH (Right Hip)   Anesthesia type: Spinal   Pre-op diagnosis: RIGHT HIP OSTEOARTHRITIS   Location: [facility] ROOM 08 / [facility]   Surgeons: Frederik Pear, MD      DISCUSSION:69 y.o. former smoker with h/o HTN, COPD (pt on 3L continuous O2), GERD, Hepatitis C treated, PE, right hip OA scheduled for above procedure 09/15/2019 with Dr. Frederik Pear.   Pt with severe COPD with continuous 3L O2.  Last seen by pulmonology 08/26/2019.  Per OV note, "Patient is at hight risk for general surgery due to respiratory issues.  I think that the patient should be given a dose of solumedrol 125mg  IV a few hours before surgery and should also be given a nebulizer treatment an hour before and also after anesthesia.  Patient should be on the lowest oxygen (post surgery) to keep the pulse ox between 90-92%.  Patient may need BiPAP after anesthesia.  I would be glad to assist you and please do not hesitate to call."  VS: BP 119/88   Pulse 94   Temp 37 C (Oral)   Resp 18   Ht 5\' 6"  (1.676 m)   Wt 56.2 kg   SpO2 100%   BMI 20.01 kg/m   PROVIDERS: Antonietta Jewel, MD is PCP last seen 09/02/2019  Gardiner Rhyme, MD is Pulmonologist  LABS: creatinine stable (all labs ordered are listed, but only abnormal results are displayed)  Labs Reviewed  SURGICAL PCR SCREEN - Abnormal; Notable for the following components:      Result Value   Staphylococcus aureus POSITIVE (*)    All other components within normal limits  APTT - Abnormal; Notable for the following components:   aPTT 40 (*)    All other components within normal limits  BASIC METABOLIC PANEL - Abnormal; Notable for the following components:   Creatinine, Ser 1.77 (*)    Calcium 8.8 (*)    GFR calc non Af Amer 39 (*)    GFR calc Af Amer 45 (*)    All other components within normal limits  CBC WITH DIFFERENTIAL/PLATELET - Abnormal; Notable for the  following components:   WBC 12.7 (*)    Hemoglobin 11.7 (*)    HCT 37.9 (*)    RDW 15.8 (*)    Neutro Abs 9.3 (*)    Eosinophils Absolute 0.6 (*)    Abs Immature Granulocytes 0.08 (*)    All other components within normal limits  URINALYSIS, ROUTINE W REFLEX MICROSCOPIC - Abnormal; Notable for the following components:   Leukocytes,Ua SMALL (*)    Bacteria, UA RARE (*)    All other components within normal limits  PROTIME-INR  TYPE AND SCREEN  ABO/RH     IMAGES:   EKG: 09/11/2019 Rate 93 bpm Normal sinus rhythm  Nonspecific ST and T wave abnormality No significant change since 2017  CV: Cardiac Cath 05/03/2016 Conclusions Diagnostic Procedure Summary 1. Normal Coronaries. 2. Mild LV dysfunction Diagnostic Procedure Recommendations 1. Medical treatment.  Signatures  Electronically signed by Rozann Lesches, MD(Diagnostic Physician) on  05/03/2016 13:01  Angiographic findings  Cardiac Arteries and Lesion Findings LMCA: 0%. LAD: 0%. LCx: 0%. RCA: 0%.  Echo 10/25/2014 SUMMARY Patient is tachycardiac througout the entire study at HRs >110bpm The left ventricular size is normal. There is normal left ventricular wall thickness.  Left ventricular systolic function is borderline reduced. LV ejection fraction = 45-50%. There is borderline  global hypokinesis of the left ventricle. The right ventricle is normal in size and function. The left atrial size is normal. There is no significant valvular stenosis or regurgitation There was insufficient TR detected to calculate RV systolic pressure. There is no pericardial effusion. There is no comparison study available. Past Medical History:  Diagnosis Date  . Anxiety   . Arthritis   . Cirrhosis (HCC)    pt and family unaware  . COPD (chronic obstructive pulmonary disease) (HCC)   . Dyspnea   . GERD (gastroesophageal reflux disease)   . Hepatitis C   . History of iron deficiency anemia   . Hypertension   . Pneumonia   .  Pulmonary embolism (HCC)    pt and girlfriend denies  . Spinal stenosis   . Vitamin D deficiency     Past Surgical History:  Procedure Laterality Date  . ELBOW SURGERY Right 1985  . ESOPHAGEAL DILATION     x3  . LEG SURGERY     screws,plates  . TOTAL HIP ARTHROPLASTY Left 11/08/2016   Procedure: TOTAL HIP ARTHROPLASTY ANTERIOR APPROACH REMOVAL OF HARDWARE;  Surgeon: Gean Birchwood, MD;  Location: [facility] OR;  Service: Orthopedics;  Laterality: Left;    MEDICATIONS: . albuterol (PROVENTIL HFA;VENTOLIN HFA) 108 (90 Base) MCG/ACT inhaler  . albuterol (PROVENTIL) (2.5 MG/3ML) 0.083% nebulizer solution  . amitriptyline (ELAVIL) 50 MG tablet  . aspirin EC 325 MG tablet  . aspirin EC 81 MG tablet  . atenolol (TENORMIN) 50 MG tablet  . Cholecalciferol (VITAMIN D) 125 MCG (5000 UT) CAPS  . cyclobenzaprine (FLEXERIL) 10 MG tablet  . guaiFENesin (MUCINEX) 600 MG 12 hr tablet  . hydrochlorothiazide (HYDRODIURIL) 25 MG tablet  . Multiple Vitamins-Minerals (CENTRUM SILVER 50+MEN) TABS  . oxyCODONE (ROXICODONE) 15 MG immediate release tablet  . oxyCODONE-acetaminophen (ROXICET) 5-325 MG tablet  . OXYGEN  . pantoprazole (PROTONIX) 40 MG tablet  . rosuvastatin (CRESTOR) 10 MG tablet  . Sennosides 25 MG TABS  . tamsulosin (FLOMAX) 0.4 MG CAPS capsule  . TRELEGY ELLIPTA 100-62.5-25 MCG/INH AEPB   No current facility-administered medications for this encounter.    Janey Genta [facility] Pre-Surgical Testing [phone] 09/12/19  1:33 PM

## 2019-09-12 NOTE — H&P (Signed)
TOTAL HIP ADMISSION H&P  Patient is admitted for right total hip arthroplasty.  Subjective:  Chief Complaint: right hip pain  HPI: Todd Cochran, 69 y.o. adult, has a history of pain and functional disability in the right hip(s) due to arthritis and patient has failed non-surgical conservative treatments for greater than 12 weeks to include NSAID's and/or analgesics, use of assistive devices and activity modification.  Onset of symptoms was gradual starting 3 years ago with gradually worsening course since that time.The patient noted no past surgery on the right hip(s).  Patient currently rates pain in the right hip at 10 out of 10 with activity. Patient has night pain, worsening of pain with activity and weight bearing, trendelenberg gait, pain that interfers with activities of daily living and pain with passive range of motion. Patient has evidence of subchondral cysts, periarticular osteophytes and joint space narrowing by imaging studies. This condition presents safety issues increasing the risk of falls.    There is no current active infection.  Patient Active Problem List   Diagnosis Date Noted  . Osteoarthritis of left hip 11/03/2016  . Osteoarthritis of right hip 09/12/2019  . Primary osteoarthritis of left hip 11/08/2016  . Dilated cardiomyopathy (HCC) 05/03/2016  . Ischemic cardiomyopathy 04/26/2016  . COPD with asthma (HCC) 10/05/2015  . Hepatic cirrhosis (HCC) 10/05/2015  . Cervical spondylosis 01/27/2014  . Dizziness 12/30/2013  . Insomnia 12/30/2013  . Low back pain 12/30/2013  . Essential hypertension 08/19/2012  . Hepatitis C infection 08/19/2012   Past Medical History:  Diagnosis Date  . Anxiety   . Arthritis   . Cirrhosis (HCC)    pt and family unaware  . COPD (chronic obstructive pulmonary disease) (HCC)   . Dyspnea   . GERD (gastroesophageal reflux disease)   . Hepatitis C   . History of iron deficiency anemia   . Hypertension   . Pneumonia   . Pulmonary  embolism (HCC)    pt and girlfriend denies  . Spinal stenosis   . Vitamin D deficiency     Past Surgical History:  Procedure Laterality Date  . ELBOW SURGERY Right 1985  . ESOPHAGEAL DILATION     x3  . LEG SURGERY     screws,plates  . TOTAL HIP ARTHROPLASTY Left 11/08/2016   Procedure: TOTAL HIP ARTHROPLASTY ANTERIOR APPROACH REMOVAL OF HARDWARE;  Surgeon: Gean Birchwood, MD;  Location: [facility] OR;  Service: Orthopedics;  Laterality: Left;    No current facility-administered medications for this encounter.   Current Outpatient Medications  Medication Sig Dispense Refill Last Dose  . albuterol (PROVENTIL HFA;VENTOLIN HFA) 108 (90 Base) MCG/ACT inhaler Inhale 2 puffs into the lungs every 6 (six) hours as needed for wheezing or shortness of breath.     Marland Kitchen albuterol (PROVENTIL) (2.5 MG/3ML) 0.083% nebulizer solution Take 2.5 mg by nebulization in the morning, at noon, and at bedtime.     Marland Kitchen amitriptyline (ELAVIL) 50 MG tablet Take 50 mg by mouth at bedtime.     Marland Kitchen aspirin EC 81 MG tablet Take 81 mg by mouth daily.     Marland Kitchen atenolol (TENORMIN) 50 MG tablet Take 50 mg by mouth every morning.      . Cholecalciferol (VITAMIN D) 125 MCG (5000 UT) CAPS Take 5,000 Units by mouth every Friday.     . cyclobenzaprine (FLEXERIL) 10 MG tablet Take 10 mg by mouth daily.     Marland Kitchen guaiFENesin (MUCINEX) 600 MG 12 hr tablet Take 600 mg by mouth 2 (two)  times daily.     . hydrochlorothiazide (HYDRODIURIL) 25 MG tablet Take 25 mg by mouth daily.     . Multiple Vitamins-Minerals (CENTRUM SILVER 50+MEN) TABS Take 1 tablet by mouth daily.     Marland Kitchen oxyCODONE (ROXICODONE) 15 MG immediate release tablet Take 15 mg by mouth every 6 (six) hours as needed for pain.     . OXYGEN Inhale 3 L into the lungs continuous.     . pantoprazole (PROTONIX) 40 MG tablet Take 40 mg by mouth 2 (two) times daily.     . rosuvastatin (CRESTOR) 10 MG tablet Take 10 mg by mouth every morning.      . Sennosides 25 MG TABS Take 50 mg by mouth every other  day.     . tamsulosin (FLOMAX) 0.4 MG CAPS capsule Take 0.4 mg by mouth in the morning and at bedtime.      . TRELEGY ELLIPTA 100-62.5-25 MCG/INH AEPB Inhale 1 puff into the lungs daily.     Marland Kitchen aspirin EC 325 MG tablet Take 1 tablet (325 mg total) by mouth 2 (two) times daily. (Patient not taking: Reported on 08/28/2019) 30 tablet 0 Not Taking at Unknown time  . oxyCODONE-acetaminophen (ROXICET) 5-325 MG tablet Take 1 tablet by mouth every 4 (four) hours as needed for severe pain. (Patient not taking: Reported on 08/28/2019) 40 tablet 0 Not Taking at Unknown time   Allergies  Allergen Reactions  . Ivp Dye [Iodinated Diagnostic Agents] Anaphylaxis    Social History   Tobacco Use  . Smoking status: Former Smoker    Years: 50.00  . Smokeless tobacco: Never Used  Substance Use Topics  . Alcohol use: No    Comment: stopped 30 yrs    Family History  Problem Relation Age of Onset  . High blood pressure Mother   . Diabetes Mother   . Asthma Sister   . Cancer Brother      Review of Systems  Constitutional: Positive for fatigue.  HENT: Negative.   Eyes: Negative.   Respiratory: Positive for shortness of breath.   Cardiovascular:       HTN  Gastrointestinal: Positive for constipation.       Heartburn  Endocrine: Negative.   Genitourinary: Negative.   Musculoskeletal: Positive for arthralgias and myalgias.  Skin: Negative.   Allergic/Immunologic: Negative.   Neurological: Positive for dizziness.  Hematological: Negative.   Psychiatric/Behavioral: Negative.     Objective:  Physical Exam  Constitutional: He is oriented to person, place, and time. He appears well-developed and well-nourished.  HENT:  Head: Normocephalic and atraumatic.  Eyes: Pupils are equal, round, and reactive to light.  Cardiovascular: Intact distal pulses.  Musculoskeletal:        General: Normal range of motion.     Cervical back: Normal range of motion and neck supple.     Comments: the patient has good  strength and good range of motion in the left hip.  The patient's right hip has severely limited range of motion in any attempted internal rotation causes significant pain.  His calves are soft and nontender.  He is neurovascular intact distally.  Neurological: He is alert and oriented to person, place, and time.  Skin: Skin is warm and dry.  Psychiatric: He has a normal mood and affect. His behavior is normal. Judgment and thought content normal.    Vital signs in last 24 hours:    Labs:   Estimated body mass index is 20.01 kg/m as calculated from the following:  Height as of 09/11/19: 5\' 6"  (1.676 m).   Weight as of 09/11/19: 56.2 kg.   Imaging Review Plain radiographs demonstrate AP of the pelvis and crosstable lateral of the right hip are taken and reviewed in office today.  Patient's left total hip arthroplasty with Tri-Lock stem appears be well-placed and well fixed.  The patient's femoral plate appears to be unchanged.  Patient's right hip has severe end-stage arthritis bone-on-bone with subchondral cyst formation and periarticular osteophyte formation.   Assessment/Plan:  End stage arthritis, right hip(s)  The patient history, physical examination, clinical judgement of the provider and imaging studies are consistent with end stage degenerative joint disease of the right hip(s) and total hip arthroplasty is deemed medically necessary. The treatment options including medical management, injection therapy, arthroscopy and arthroplasty were discussed at length. The risks and benefits of total hip arthroplasty were presented and reviewed. The risks due to aseptic loosening, infection, stiffness, dislocation/subluxation,  thromboembolic complications and other imponderables were discussed.  The patient acknowledged the explanation, agreed to proceed with the plan and consent was signed. Patient is being admitted for inpatient treatment for surgery, pain control, PT, OT, prophylactic  antibiotics, VTE prophylaxis, progressive ambulation and ADL's and discharge planning.The patient is planning to be discharged home with home health services

## 2019-09-14 MED ORDER — BUPIVACAINE LIPOSOME 1.3 % IJ SUSP
10.0000 mL | INTRAMUSCULAR | Status: DC
  Filled 2019-09-14: qty 10

## 2019-09-14 MED ORDER — TRANEXAMIC ACID 1000 MG/10ML IV SOLN
2000.0000 mg | INTRAVENOUS | Status: DC
  Filled 2019-09-14: qty 20

## 2019-09-15 ENCOUNTER — Ambulatory Visit (HOSPITAL_COMMUNITY): Payer: 59 | Admitting: Physician Assistant

## 2019-09-15 ENCOUNTER — Ambulatory Visit (HOSPITAL_COMMUNITY): Payer: 59

## 2019-09-15 ENCOUNTER — Other Ambulatory Visit: Payer: Self-pay

## 2019-09-15 ENCOUNTER — Ambulatory Visit (HOSPITAL_COMMUNITY): Payer: 59 | Admitting: Certified Registered"

## 2019-09-15 ENCOUNTER — Encounter (HOSPITAL_COMMUNITY)
Admission: RE | Disposition: A | Discharge status: Home / Self Care | Payer: Self-pay | Source: Home / Self Care | Attending: Orthopedic Surgery

## 2019-09-15 ENCOUNTER — Encounter (HOSPITAL_COMMUNITY): Payer: Self-pay | Admitting: Orthopedic Surgery

## 2019-09-15 ENCOUNTER — Ambulatory Visit (HOSPITAL_COMMUNITY)
Admission: RE | Admit: 2019-09-15 | Discharge: 2019-09-16 | Disposition: A | Discharge status: Home / Self Care | Payer: 59 | Attending: Orthopedic Surgery | Admitting: Orthopedic Surgery

## 2019-09-15 DIAGNOSIS — K219 Gastro-esophageal reflux disease without esophagitis: Secondary | ICD-10-CM | POA: Insufficient documentation

## 2019-09-15 DIAGNOSIS — K7460 Unspecified cirrhosis of liver: Secondary | ICD-10-CM | POA: Insufficient documentation

## 2019-09-15 DIAGNOSIS — Z79899 Other long term (current) drug therapy: Secondary | ICD-10-CM | POA: Insufficient documentation

## 2019-09-15 DIAGNOSIS — Z87892 Personal history of anaphylaxis: Secondary | ICD-10-CM | POA: Diagnosis not present

## 2019-09-15 DIAGNOSIS — Z9981 Dependence on supplemental oxygen: Secondary | ICD-10-CM | POA: Diagnosis not present

## 2019-09-15 DIAGNOSIS — Z7951 Long term (current) use of inhaled steroids: Secondary | ICD-10-CM | POA: Diagnosis not present

## 2019-09-15 DIAGNOSIS — J449 Chronic obstructive pulmonary disease, unspecified: Secondary | ICD-10-CM | POA: Insufficient documentation

## 2019-09-15 DIAGNOSIS — I10 Essential (primary) hypertension: Secondary | ICD-10-CM | POA: Diagnosis not present

## 2019-09-15 DIAGNOSIS — M1611 Unilateral primary osteoarthritis, right hip: Secondary | ICD-10-CM | POA: Diagnosis present

## 2019-09-15 DIAGNOSIS — Z7982 Long term (current) use of aspirin: Secondary | ICD-10-CM | POA: Diagnosis not present

## 2019-09-15 DIAGNOSIS — Z96642 Presence of left artificial hip joint: Secondary | ICD-10-CM | POA: Insufficient documentation

## 2019-09-15 DIAGNOSIS — Z96641 Presence of right artificial hip joint: Secondary | ICD-10-CM

## 2019-09-15 DIAGNOSIS — D62 Acute posthemorrhagic anemia: Secondary | ICD-10-CM | POA: Insufficient documentation

## 2019-09-15 DIAGNOSIS — E559 Vitamin D deficiency, unspecified: Secondary | ICD-10-CM | POA: Diagnosis not present

## 2019-09-15 DIAGNOSIS — Z87891 Personal history of nicotine dependence: Secondary | ICD-10-CM | POA: Insufficient documentation

## 2019-09-15 DIAGNOSIS — F419 Anxiety disorder, unspecified: Secondary | ICD-10-CM | POA: Insufficient documentation

## 2019-09-15 DIAGNOSIS — I255 Ischemic cardiomyopathy: Secondary | ICD-10-CM | POA: Insufficient documentation

## 2019-09-15 DIAGNOSIS — S72041A Displaced fracture of base of neck of right femur, initial encounter for closed fracture: Secondary | ICD-10-CM

## 2019-09-15 DIAGNOSIS — Z91041 Radiographic dye allergy status: Secondary | ICD-10-CM | POA: Diagnosis not present

## 2019-09-15 DIAGNOSIS — I420 Dilated cardiomyopathy: Secondary | ICD-10-CM | POA: Insufficient documentation

## 2019-09-15 HISTORY — DX: Presence of right artificial hip joint: Z96.641

## 2019-09-15 HISTORY — PX: TOTAL HIP ARTHROPLASTY: SHX124

## 2019-09-15 LAB — TYPE AND SCREEN
ABO/RH(D): A POS
Antibody Screen: NEGATIVE

## 2019-09-15 SURGERY — ARTHROPLASTY, HIP, TOTAL, ANTERIOR APPROACH
Anesthesia: Spinal | Site: Hip | Laterality: Right

## 2019-09-15 MED ORDER — OXYCODONE HCL 5 MG PO TABS: 5.0000 mg | ORAL_TABLET | ORAL | Status: DC | PRN

## 2019-09-15 MED ORDER — FLUTICASONE-UMECLIDIN-VILANT 100-62.5-25 MCG/INH IN AEPB: 1.0000 | INHALATION_SPRAY | Freq: Every day | RESPIRATORY_TRACT | Status: DC

## 2019-09-15 MED ORDER — PHENYLEPHRINE 40 MCG/ML (10ML) SYRINGE FOR IV PUSH (FOR BLOOD PRESSURE SUPPORT)
PREFILLED_SYRINGE | INTRAVENOUS | Status: AC
  Filled 2019-09-15: qty 10

## 2019-09-15 MED ORDER — MIDAZOLAM HCL 2 MG/2ML IJ SOLN
INTRAMUSCULAR | Status: AC
  Filled 2019-09-15: qty 2

## 2019-09-15 MED ORDER — DOCUSATE SODIUM 100 MG PO CAPS
100.0000 mg | ORAL_CAPSULE | Freq: Two times a day (BID) | ORAL | Status: DC
  Administered 2019-09-15 – 2019-09-16 (×3): 100 mg via ORAL
  Filled 2019-09-15 (×3): qty 1

## 2019-09-15 MED ORDER — ALBUTEROL SULFATE (2.5 MG/3ML) 0.083% IN NEBU
2.5000 mg | INHALATION_SOLUTION | Freq: Three times a day (TID) | RESPIRATORY_TRACT | Status: DC
  Administered 2019-09-15 – 2019-09-16 (×4): 2.5 mg via RESPIRATORY_TRACT
  Filled 2019-09-15 (×3): qty 3

## 2019-09-15 MED ORDER — ONDANSETRON HCL 4 MG/2ML IJ SOLN: 4.0000 mg | Freq: Four times a day (QID) | INTRAMUSCULAR | Status: DC | PRN

## 2019-09-15 MED ORDER — ASPIRIN EC 81 MG PO TBEC
81.0000 mg | DELAYED_RELEASE_TABLET | Freq: Two times a day (BID) | ORAL | 0 refills | Status: DC
Start: 2019-09-15 — End: 2024-02-25

## 2019-09-15 MED ORDER — PANTOPRAZOLE SODIUM 40 MG PO TBEC
40.0000 mg | DELAYED_RELEASE_TABLET | Freq: Two times a day (BID) | ORAL | Status: DC
  Administered 2019-09-15 – 2019-09-16 (×2): 40 mg via ORAL
  Filled 2019-09-15 (×2): qty 1

## 2019-09-15 MED ORDER — FENTANYL CITRATE (PF) 100 MCG/2ML IJ SOLN
INTRAMUSCULAR | Status: DC | PRN
  Administered 2019-09-15: 25 ug via INTRAVENOUS

## 2019-09-15 MED ORDER — STERILE WATER FOR IRRIGATION IR SOLN
Status: DC | PRN
  Administered 2019-09-15: 2000 mL

## 2019-09-15 MED ORDER — OXYCODONE HCL 5 MG PO TABS: 5.0000 mg | ORAL_TABLET | Freq: Once | ORAL | Status: DC | PRN

## 2019-09-15 MED ORDER — DEXAMETHASONE SODIUM PHOSPHATE 10 MG/ML IJ SOLN
INTRAMUSCULAR | Status: AC
  Filled 2019-09-15: qty 1

## 2019-09-15 MED ORDER — ALBUTEROL SULFATE (2.5 MG/3ML) 0.083% IN NEBU
INHALATION_SOLUTION | RESPIRATORY_TRACT | Status: AC
  Filled 2019-09-15: qty 3

## 2019-09-15 MED ORDER — OXYCODONE HCL 5 MG PO TABS
15.0000 mg | ORAL_TABLET | Freq: Four times a day (QID) | ORAL | Status: DC | PRN
  Administered 2019-09-15 – 2019-09-16 (×5): 15 mg via ORAL
  Filled 2019-09-15 (×5): qty 3

## 2019-09-15 MED ORDER — LACTATED RINGERS IV SOLN: INTRAVENOUS | Status: DC

## 2019-09-15 MED ORDER — ALBUTEROL SULFATE (2.5 MG/3ML) 0.083% IN NEBU: 2.5000 mg | INHALATION_SOLUTION | Freq: Four times a day (QID) | RESPIRATORY_TRACT | Status: DC | PRN

## 2019-09-15 MED ORDER — BUPIVACAINE LIPOSOME 1.3 % IJ SUSP
INTRAMUSCULAR | Status: DC | PRN
  Administered 2019-09-15: 10 mL

## 2019-09-15 MED ORDER — PROPOFOL 500 MG/50ML IV EMUL
INTRAVENOUS | Status: DC | PRN
  Administered 2019-09-15: 50 ug/kg/min via INTRAVENOUS

## 2019-09-15 MED ORDER — METHOCARBAMOL 500 MG PO TABS
500.0000 mg | ORAL_TABLET | Freq: Four times a day (QID) | ORAL | Status: DC | PRN
  Administered 2019-09-15 – 2019-09-16 (×2): 500 mg via ORAL
  Filled 2019-09-15 (×2): qty 1

## 2019-09-15 MED ORDER — 0.9 % SODIUM CHLORIDE (POUR BTL) OPTIME
TOPICAL | Status: DC | PRN
  Administered 2019-09-15: 1000 mL

## 2019-09-15 MED ORDER — ONDANSETRON HCL 4 MG PO TABS: 4.0000 mg | ORAL_TABLET | Freq: Four times a day (QID) | ORAL | Status: DC | PRN

## 2019-09-15 MED ORDER — PHENOL 1.4 % MT LIQD: 1.0000 | OROMUCOSAL | Status: DC | PRN

## 2019-09-15 MED ORDER — SENNA 8.6 MG PO TABS
51.6000 mg | ORAL_TABLET | ORAL | Status: DC
  Administered 2019-09-16: 51.6 mg via ORAL
  Filled 2019-09-15: qty 6

## 2019-09-15 MED ORDER — MENTHOL 3 MG MT LOZG: 1.0000 | LOZENGE | OROMUCOSAL | Status: DC | PRN

## 2019-09-15 MED ORDER — PHENYLEPHRINE 40 MCG/ML (10ML) SYRINGE FOR IV PUSH (FOR BLOOD PRESSURE SUPPORT)
PREFILLED_SYRINGE | INTRAVENOUS | Status: DC | PRN
  Administered 2019-09-15 (×7): 40 ug via INTRAVENOUS
  Administered 2019-09-15: 80 ug via INTRAVENOUS
  Administered 2019-09-15: 40 ug via INTRAVENOUS

## 2019-09-15 MED ORDER — ONDANSETRON HCL 4 MG/2ML IJ SOLN: 4.0000 mg | Freq: Once | INTRAMUSCULAR | Status: DC | PRN

## 2019-09-15 MED ORDER — FENTANYL CITRATE (PF) 100 MCG/2ML IJ SOLN: 25.0000 ug | INTRAMUSCULAR | Status: DC | PRN

## 2019-09-15 MED ORDER — KCL IN DEXTROSE-NACL 20-5-0.45 MEQ/L-%-% IV SOLN
INTRAVENOUS | Status: DC
  Filled 2019-09-15 (×3): qty 1000

## 2019-09-15 MED ORDER — EPHEDRINE 5 MG/ML INJ
INTRAVENOUS | Status: AC
  Filled 2019-09-15: qty 10

## 2019-09-15 MED ORDER — ALBUTEROL SULFATE HFA 108 (90 BASE) MCG/ACT IN AERS: 2.0000 | INHALATION_SPRAY | Freq: Four times a day (QID) | RESPIRATORY_TRACT | Status: DC | PRN

## 2019-09-15 MED ORDER — ALBUMIN HUMAN 5 % IV SOLN
INTRAVENOUS | Status: DC | PRN
Start: 2019-09-15 — End: 2019-09-15

## 2019-09-15 MED ORDER — METHYLPREDNISOLONE SODIUM SUCC 125 MG IJ SOLR
125.0000 mg | Freq: Once | INTRAMUSCULAR | Status: AC
  Administered 2019-09-15: 125 mg via INTRAVENOUS
  Filled 2019-09-15: qty 2

## 2019-09-15 MED ORDER — DEXAMETHASONE SODIUM PHOSPHATE 10 MG/ML IJ SOLN
10.0000 mg | Freq: Once | INTRAMUSCULAR | Status: AC
  Administered 2019-09-16: 10 mg via INTRAVENOUS
  Filled 2019-09-15: qty 1

## 2019-09-15 MED ORDER — OXYCODONE-ACETAMINOPHEN 5-325 MG PO TABS: 1.0000 | ORAL_TABLET | ORAL | 0 refills | Status: DC | PRN

## 2019-09-15 MED ORDER — SODIUM CHLORIDE (PF) 0.9 % IJ SOLN
INTRAMUSCULAR | Status: AC
  Filled 2019-09-15: qty 50

## 2019-09-15 MED ORDER — ATENOLOL 50 MG PO TABS
50.0000 mg | ORAL_TABLET | ORAL | Status: DC
  Administered 2019-09-16: 50 mg via ORAL
  Filled 2019-09-15: qty 1

## 2019-09-15 MED ORDER — METOCLOPRAMIDE HCL 5 MG/ML IJ SOLN: 5.0000 mg | Freq: Three times a day (TID) | INTRAMUSCULAR | Status: DC | PRN

## 2019-09-15 MED ORDER — ONDANSETRON HCL 4 MG/2ML IJ SOLN
INTRAMUSCULAR | Status: DC | PRN
  Administered 2019-09-15: 4 mg via INTRAVENOUS

## 2019-09-15 MED ORDER — GABAPENTIN 100 MG PO CAPS
100.0000 mg | ORAL_CAPSULE | Freq: Three times a day (TID) | ORAL | Status: DC
  Administered 2019-09-15 – 2019-09-16 (×5): 100 mg via ORAL
  Filled 2019-09-15 (×5): qty 1

## 2019-09-15 MED ORDER — ACETAMINOPHEN 500 MG PO TABS
1000.0000 mg | ORAL_TABLET | Freq: Four times a day (QID) | ORAL | Status: AC
  Administered 2019-09-15 – 2019-09-16 (×4): 1000 mg via ORAL
  Filled 2019-09-15 (×4): qty 2

## 2019-09-15 MED ORDER — METOCLOPRAMIDE HCL 5 MG PO TABS: 5.0000 mg | ORAL_TABLET | Freq: Three times a day (TID) | ORAL | Status: DC | PRN

## 2019-09-15 MED ORDER — TAMSULOSIN HCL 0.4 MG PO CAPS
0.4000 mg | ORAL_CAPSULE | Freq: Every day | ORAL | Status: DC
  Administered 2019-09-16: 0.4 mg via ORAL
  Filled 2019-09-15: qty 1

## 2019-09-15 MED ORDER — ACETAMINOPHEN 325 MG PO TABS: 325.0000 mg | ORAL_TABLET | Freq: Four times a day (QID) | ORAL | Status: DC | PRN

## 2019-09-15 MED ORDER — ORAL CARE MOUTH RINSE
15.0000 mL | Freq: Two times a day (BID) | OROMUCOSAL | Status: DC
  Administered 2019-09-15: 15 mL via OROMUCOSAL

## 2019-09-15 MED ORDER — PHENYLEPHRINE HCL-NACL 10-0.9 MG/250ML-% IV SOLN
INTRAVENOUS | Status: DC | PRN
  Administered 2019-09-15: 20 ug/min via INTRAVENOUS

## 2019-09-15 MED ORDER — EPHEDRINE SULFATE-NACL 50-0.9 MG/10ML-% IV SOSY
PREFILLED_SYRINGE | INTRAVENOUS | Status: DC | PRN
  Administered 2019-09-15 (×2): 10 mg via INTRAVENOUS
  Administered 2019-09-15: 5 mg via INTRAVENOUS
  Administered 2019-09-15 (×2): 10 mg via INTRAVENOUS
  Administered 2019-09-15: 5 mg via INTRAVENOUS

## 2019-09-15 MED ORDER — BISACODYL 5 MG PO TBEC: 5.0000 mg | DELAYED_RELEASE_TABLET | Freq: Every day | ORAL | Status: DC | PRN

## 2019-09-15 MED ORDER — DEXAMETHASONE SODIUM PHOSPHATE 10 MG/ML IJ SOLN
INTRAMUSCULAR | Status: DC | PRN
  Administered 2019-09-15: 4 mg via INTRAVENOUS

## 2019-09-15 MED ORDER — ALBUTEROL SULFATE (2.5 MG/3ML) 0.083% IN NEBU
2.5000 mg | INHALATION_SOLUTION | Freq: Once | RESPIRATORY_TRACT | Status: AC
  Administered 2019-09-15: 2.5 mg via RESPIRATORY_TRACT
  Filled 2019-09-15: qty 3

## 2019-09-15 MED ORDER — DIPHENHYDRAMINE HCL 12.5 MG/5ML PO ELIX: 12.5000 mg | ORAL_SOLUTION | ORAL | Status: DC | PRN

## 2019-09-15 MED ORDER — TRANEXAMIC ACID-NACL 1000-0.7 MG/100ML-% IV SOLN
1000.0000 mg | INTRAVENOUS | Status: AC
  Administered 2019-09-15: 1000 mg via INTRAVENOUS
  Filled 2019-09-15: qty 100

## 2019-09-15 MED ORDER — HYDROMORPHONE HCL 1 MG/ML IJ SOLN
0.5000 mg | INTRAMUSCULAR | Status: DC | PRN
  Administered 2019-09-15: 1 mg via INTRAVENOUS
  Filled 2019-09-15: qty 1

## 2019-09-15 MED ORDER — SODIUM CHLORIDE 0.9% FLUSH
INTRAVENOUS | Status: DC | PRN
  Administered 2019-09-15: 50 mL via INTRAVENOUS

## 2019-09-15 MED ORDER — ASPIRIN 81 MG PO CHEW
81.0000 mg | CHEWABLE_TABLET | Freq: Two times a day (BID) | ORAL | Status: DC
  Administered 2019-09-15 – 2019-09-16 (×2): 81 mg via ORAL
  Filled 2019-09-15 (×2): qty 1

## 2019-09-15 MED ORDER — METHOCARBAMOL 500 MG IVPB - SIMPLE MED
500.0000 mg | Freq: Four times a day (QID) | INTRAVENOUS | Status: DC | PRN
  Filled 2019-09-15: qty 50

## 2019-09-15 MED ORDER — TRANEXAMIC ACID 1000 MG/10ML IV SOLN
INTRAVENOUS | Status: DC | PRN
  Administered 2019-09-15: 2000 mg via TOPICAL

## 2019-09-15 MED ORDER — POVIDONE-IODINE 10 % EX SWAB
2.0000 "application " | Freq: Once | CUTANEOUS | Status: AC
  Administered 2019-09-15: 2 via TOPICAL

## 2019-09-15 MED ORDER — PROPOFOL 10 MG/ML IV BOLUS
INTRAVENOUS | Status: AC
  Filled 2019-09-15: qty 20

## 2019-09-15 MED ORDER — PROPOFOL 1000 MG/100ML IV EMUL
INTRAVENOUS | Status: AC
  Filled 2019-09-15: qty 100

## 2019-09-15 MED ORDER — UMECLIDINIUM BROMIDE 62.5 MCG/INH IN AEPB
1.0000 | INHALATION_SPRAY | Freq: Every day | RESPIRATORY_TRACT | Status: DC
  Administered 2019-09-16: 1 via RESPIRATORY_TRACT
  Filled 2019-09-15: qty 7

## 2019-09-15 MED ORDER — GUAIFENESIN ER 600 MG PO TB12
600.0000 mg | ORAL_TABLET | Freq: Two times a day (BID) | ORAL | Status: DC
  Administered 2019-09-15 – 2019-09-16 (×2): 600 mg via ORAL
  Filled 2019-09-15 (×2): qty 1

## 2019-09-15 MED ORDER — TIZANIDINE HCL 2 MG PO TABS
2.0000 mg | ORAL_TABLET | Freq: Four times a day (QID) | ORAL | 0 refills | Status: DC | PRN
Start: 2019-09-15 — End: 2020-08-19

## 2019-09-15 MED ORDER — FLUTICASONE FUROATE-VILANTEROL 100-25 MCG/INH IN AEPB
1.0000 | INHALATION_SPRAY | Freq: Every day | RESPIRATORY_TRACT | Status: DC
  Administered 2019-09-16: 1 via RESPIRATORY_TRACT
  Filled 2019-09-15: qty 28

## 2019-09-15 MED ORDER — ROSUVASTATIN CALCIUM 10 MG PO TABS
10.0000 mg | ORAL_TABLET | ORAL | Status: DC
  Administered 2019-09-16: 10 mg via ORAL
  Filled 2019-09-15: qty 1

## 2019-09-15 MED ORDER — FLEET ENEMA 7-19 GM/118ML RE ENEM: 1.0000 | ENEMA | Freq: Once | RECTAL | Status: DC | PRN

## 2019-09-15 MED ORDER — LIDOCAINE 2% (20 MG/ML) 5 ML SYRINGE
INTRAMUSCULAR | Status: AC
  Filled 2019-09-15: qty 5

## 2019-09-15 MED ORDER — HYDROCHLOROTHIAZIDE 25 MG PO TABS
25.0000 mg | ORAL_TABLET | Freq: Every day | ORAL | Status: DC
  Administered 2019-09-16: 25 mg via ORAL
  Filled 2019-09-15: qty 1

## 2019-09-15 MED ORDER — PHENYLEPHRINE HCL (PRESSORS) 10 MG/ML IV SOLN
INTRAVENOUS | Status: AC
  Filled 2019-09-15: qty 1

## 2019-09-15 MED ORDER — OXYCODONE HCL 5 MG/5ML PO SOLN: 5.0000 mg | Freq: Once | ORAL | Status: DC | PRN

## 2019-09-15 MED ORDER — FENTANYL CITRATE (PF) 100 MCG/2ML IJ SOLN
INTRAMUSCULAR | Status: AC
  Filled 2019-09-15: qty 2

## 2019-09-15 MED ORDER — TRANEXAMIC ACID-NACL 1000-0.7 MG/100ML-% IV SOLN
1000.0000 mg | Freq: Once | INTRAVENOUS | Status: AC
  Administered 2019-09-15: 1000 mg via INTRAVENOUS
  Filled 2019-09-15: qty 100

## 2019-09-15 MED ORDER — BUPIVACAINE-EPINEPHRINE 0.25% -1:200000 IJ SOLN
INTRAMUSCULAR | Status: DC | PRN
  Administered 2019-09-15: 30 mL

## 2019-09-15 MED ORDER — ONDANSETRON HCL 4 MG/2ML IJ SOLN
INTRAMUSCULAR | Status: AC
  Filled 2019-09-15: qty 2

## 2019-09-15 MED ORDER — POLYETHYLENE GLYCOL 3350 17 G PO PACK: 17.0000 g | PACK | Freq: Every day | ORAL | Status: DC | PRN

## 2019-09-15 MED ORDER — CEFAZOLIN SODIUM-DEXTROSE 2-4 GM/100ML-% IV SOLN
2.0000 g | INTRAVENOUS | Status: AC
  Administered 2019-09-15: 2 g via INTRAVENOUS
  Filled 2019-09-15: qty 100

## 2019-09-15 MED ORDER — BUPIVACAINE-EPINEPHRINE 0.25% -1:200000 IJ SOLN
INTRAMUSCULAR | Status: AC
  Filled 2019-09-15: qty 1

## 2019-09-15 MED ORDER — BUPIVACAINE IN DEXTROSE 0.75-8.25 % IT SOLN
INTRATHECAL | Status: DC | PRN
  Administered 2019-09-15: 1.6 mL via INTRATHECAL

## 2019-09-15 MED ORDER — LIDOCAINE 2% (20 MG/ML) 5 ML SYRINGE
INTRAMUSCULAR | Status: DC | PRN
  Administered 2019-09-15: 40 mg via INTRAVENOUS

## 2019-09-15 MED ORDER — AMITRIPTYLINE HCL 50 MG PO TABS
50.0000 mg | ORAL_TABLET | Freq: Every day | ORAL | Status: DC
  Administered 2019-09-15: 50 mg via ORAL
  Filled 2019-09-15 (×2): qty 1

## 2019-09-15 MED ORDER — ALUM & MAG HYDROXIDE-SIMETH 200-200-20 MG/5ML PO SUSP: 30.0000 mL | ORAL | Status: DC | PRN

## 2019-09-15 MED ORDER — MIDAZOLAM HCL 2 MG/2ML IJ SOLN
INTRAMUSCULAR | Status: DC | PRN
  Administered 2019-09-15: 1 mg via INTRAVENOUS

## 2019-09-15 SURGICAL SUPPLY — 51 items
BAG DECANTER FOR FLEXI CONT (MISCELLANEOUS) ×3 IMPLANT
BALL HIP CERAMIC (Hips) IMPLANT
BLADE SAW SGTL 18X1.27X75 (BLADE) ×2 IMPLANT
BLADE SAW SGTL 18X1.27X75MM (BLADE) ×1
BLADE SURG SZ10 CARB STEEL (BLADE) ×6 IMPLANT
CNTNR URN SCR LID CUP LEK RST (MISCELLANEOUS) ×1 IMPLANT
CONT SPEC 4OZ STRL OR WHT (MISCELLANEOUS) ×2
COVER PERINEAL POST (MISCELLANEOUS) ×3 IMPLANT
COVER SURGICAL LIGHT HANDLE (MISCELLANEOUS) ×3 IMPLANT
COVER WAND RF STERILE (DRAPES) ×3 IMPLANT
CUP ACETBLR 48 OD 100 SERIES (Hips) ×2 IMPLANT
DECANTER SPIKE VIAL GLASS SM (MISCELLANEOUS) ×6 IMPLANT
DRAPE STERI IOBAN 125X83 (DRAPES) ×3 IMPLANT
DRAPE U-SHAPE 47X51 STRL (DRAPES) ×6 IMPLANT
DRESSING AQUACEL AG SP 3.5X10 (GAUZE/BANDAGES/DRESSINGS) IMPLANT
DRSG AQUACEL AG ADV 3.5X10 (GAUZE/BANDAGES/DRESSINGS) ×3 IMPLANT
DRSG AQUACEL AG SP 3.5X10 (GAUZE/BANDAGES/DRESSINGS) ×3
DURAPREP 26ML APPLICATOR (WOUND CARE) ×3 IMPLANT
ELECT BLADE TIP CTD 4 INCH (ELECTRODE) ×3 IMPLANT
ELECT REM PT RETURN 15FT ADLT (MISCELLANEOUS) ×3 IMPLANT
ELIMINATOR HOLE APEX DEPUY (Hips) ×2 IMPLANT
GLOVE BIO SURGEON STRL SZ7.5 (GLOVE) ×3 IMPLANT
GLOVE BIO SURGEON STRL SZ8.5 (GLOVE) ×3 IMPLANT
GLOVE BIOGEL PI IND STRL 8 (GLOVE) ×1 IMPLANT
GLOVE BIOGEL PI IND STRL 9 (GLOVE) ×1 IMPLANT
GLOVE BIOGEL PI INDICATOR 8 (GLOVE) ×2
GLOVE BIOGEL PI INDICATOR 9 (GLOVE) ×2
GOWN STRL REUS W/TWL XL LVL3 (GOWN DISPOSABLE) ×6 IMPLANT
HIP BALL CERAMIC (Hips) ×3 IMPLANT
HOLDER FOLEY CATH W/STRAP (MISCELLANEOUS) ×3 IMPLANT
KIT TURNOVER KIT A (KITS) IMPLANT
MANIFOLD NEPTUNE II (INSTRUMENTS) ×3 IMPLANT
NDL HYPO 21X1.5 SAFETY (NEEDLE) ×2 IMPLANT
NEEDLE HYPO 21X1.5 SAFETY (NEEDLE) ×6 IMPLANT
NS IRRIG 1000ML POUR BTL (IV SOLUTION) ×3 IMPLANT
PACK ANTERIOR HIP CUSTOM (KITS) ×3 IMPLANT
PENCIL SMOKE EVACUATOR (MISCELLANEOUS) IMPLANT
PINN ALTRX NEUT ID X OD 32X48 ×2 IMPLANT
STEM FEMORAL SZ5 HIGH ACTIS (Stem) ×2 IMPLANT
SUT ETHIBOND NAB CT1 #1 30IN (SUTURE) ×3 IMPLANT
SUT VIC AB 0 CT1 27 (SUTURE) ×2
SUT VIC AB 0 CT1 27XBRD ANBCTR (SUTURE) ×1 IMPLANT
SUT VIC AB 1 CTX 36 (SUTURE) ×2
SUT VIC AB 1 CTX36XBRD ANBCTR (SUTURE) ×1 IMPLANT
SUT VIC AB 2-0 CT1 27 (SUTURE)
SUT VIC AB 2-0 CT1 TAPERPNT 27 (SUTURE) IMPLANT
SUT VIC AB 3-0 CT1 27 (SUTURE) ×2
SUT VIC AB 3-0 CT1 TAPERPNT 27 (SUTURE) ×1 IMPLANT
SYR CONTROL 10ML LL (SYRINGE) ×9 IMPLANT
TRAY FOLEY MTR SLVR 16FR STAT (SET/KITS/TRAYS/PACK) IMPLANT
YANKAUER SUCT BULB TIP 10FT TU (MISCELLANEOUS) ×3 IMPLANT

## 2019-09-15 NOTE — Anesthesia Procedure Notes (Signed)
Procedure Name: MAC Date/Time: 09/15/2019 7:16 AM Performed by: Eben Burow, CRNA Pre-anesthesia Checklist: Patient identified, Emergency Drugs available, Suction available, Patient being monitored and Timeout performed Oxygen Delivery Method: Simple face mask Dental Injury: Teeth and Oropharynx as per pre-operative assessment

## 2019-09-15 NOTE — Interval H&P Note (Signed)
History and Physical Interval Note:  09/15/2019 6:55 AM  Todd Cochran  has presented today for surgery, with the diagnosis of RIGHT HIP OSTEOARTHRITIS.  The various methods of treatment have been discussed with the patient and family. After consideration of risks, benefits and other options for treatment, the patient has consented to  Procedure(s): TOTAL HIP ARTHROPLASTY ANTERIOR APPROACH (Right) as a surgical intervention.  The patient's history has been reviewed, patient examined, no change in status, stable for surgery.  I have reviewed the patient's chart and labs.  Questions were answered to the patient's satisfaction.     Nestor Lewandowsky

## 2019-09-15 NOTE — Anesthesia Postprocedure Evaluation (Signed)
Anesthesia Post Note  Patient: Stephon D Fugitt  Procedure(s) Performed: TOTAL HIP ARTHROPLASTY ANTERIOR APPROACH (Right Hip)     Patient location during evaluation: PACU Anesthesia Type: Spinal Level of consciousness: awake and alert Pain management: pain level controlled Vital Signs Assessment: post-procedure vital signs reviewed and stable Respiratory status: spontaneous breathing, respiratory function stable and patient connected to nasal cannula oxygen Cardiovascular status: blood pressure returned to baseline and stable Postop Assessment: spinal receding and no apparent nausea or vomiting Anesthetic complications: no    Last Vitals:  Vitals:   09/15/19 1015 09/15/19 1020  BP: (!) 119/94 103/90  Pulse: 78 80  Resp: 14 15  Temp:    SpO2: 93% 92%    Last Pain:  Vitals:   09/15/19 1000  TempSrc:   PainSc: 0-No pain                 Beryle Lathe

## 2019-09-15 NOTE — Op Note (Signed)
PATIENT ID:      Todd Cochran  MRN:     [id number] DOB/AGE:    08-14-1950 / 69 y.o.  OPERATIVE REPORT   DATE OF PROCEDURE:  09/15/2019      PREOPERATIVE DIAGNOSIS:  RIGHT HIP OSTEOARTHRITIS                                                         POSTOPERATIVE DIAGNOSIS:  Same                                                         PROCEDURE: Anterior R total hip arthroplasty using a 48 mm DePuy Pinnacle  Cup, [employer], 0-degree polyethylene liner, a +5 mm x 61mm ceramic head, a 5 hi Depuy Actis stem  SURGEON: Nestor Lewandowsky  ASSISTANT:   Tomi Likens. [employer]  (present throughout entire procedure and necessary for timely completion of the procedure)   ANESTHESIA: Spinal, Exparel 133mg  injection BLOOD LOSS: 400 cc FLUID REPLACEMENT: 1600 cc crystalloid TRANEXAMIC ACID: 1gm IV, 2gm Topical COMPLICATIONS: none    INDICATIONS FOR PROCEDURE: A 69 y.o. year-old With  RIGHT HIP OSTEOARTHRITIS   for 3 years, x-rays show bone-on-bone arthritic changes, and osteophytes. Despite conservative measures with observation, anti-inflammatory medicine, narcotics, use of a cane, has severe unremitting pain and can ambulate only a few blocks before resting. Patient desires elective R total hip arthroplasty to decrease pain and increase function. The risks, benefits, and alternatives were discussed at length including but not limited to the risks of infection, bleeding, nerve injury, stiffness, blood clots, the need for revision surgery, cardiopulmonary complications, among others, and they were willing to proceed. Questions answered      PROCEDURE IN DETAIL: The patient was identified by armband,   received preoperative IV antibiotics in the holding area at [facility], taken to the operating room , appropriate anesthetic monitors   were attached and anesthesia was induced with the patient on the gurney. HANA boots were applied to the feet, and the patient  was transferred to the HANA  table with a peroneal post and support underneath the non-operative leg. Theoperative lower extremity was then prepped and draped in the usual sterile fashion from just above the iliac crest to the knee. And a timeout procedure was performed. [facility]. Tomi Likens [facility] was present and scrubbed throughout the case, critical for assistance with, positioning, exposure, retraction, instrumentation, and closure.Skin along incision area was injected with 10 cc of Exparel solution. We then made a 12 cm incision along the interval at the leading edge of the tensor fascia lata of starting at 2 cm lateral to the ASIS. Small bleeders in the skin and subcutaneous tissue identified and cauterized we dissected down to the fascia and made an incision in the fascia allowing [facility] to elevate the fascia of the tensor muscle and exploited the interval between the rectus and the tensor fascia lata. A Cobra retractor was then placed along the superior neck of the femur. A cerebellar retractor was used to expose the interval between the tensor fascia lata and the rectus femoris.  We identified  and cauterized the ascending branch of the anterior circumflex artery. A second Cobra retractor along the inferior neck of the femur. A small Hohmann retractor was placed underneath the origin of the rectus femoris, giving [place] good medial exposure. Using Ronguers fatty tissue was removed from in front of the anterior capsule. The capsule was then incised, starting out at the superior anterior rim of the acetabulum going laterally along the anterior neck. The capsule was then teed along the neck superiorly and inferiorly. Electrocautery was used to release capsule from the anterior and medial neck of the femur to allow external rotation. Cobra retractors were then placed along the inferior and superior neck allowing [place] to perform a standard neck cut and removed the femoral head with a power corkscrew. We then placed a medium bent homan retractor in the  cotyloid notch and posteriorly along the acetabular rim a narrow Cobra retractor. Exposed labral tissue and osteophytes were then removed. We then sequentially reamed up to a 47 mm basket reamer obtaining good coverage in all quadrants, verified by C-arm imaging. Under C-arm control we then hammered into place a 48 mm Pinnacle cup in 45 of abduction and 15 of anteversion. The cup seated nicely and required no supplemental screws. We then placed a central hole Eliminator and a 0 polyethylene liner. The foot was then externally rotated to 130-140. The limb was extended and adducted to the floor, delivering the proximal femur up into the wound. A medium curved Hohmann retractor was placed over the greater trochanter and a long Homan retractor along the posterior femoral neck completing the exposure and lateralizing the femur. We then performed releases superiorly and and inferiorly of the capsule going back to the pirformis fossa superiorly and to the lesser trochanter inferiorly. We then entered the proximal femur with the box cutting offset chisel followed by, a canal sounder, the chili pepper and broaching up to a 5 broach. This seated nicely and we reamed the calcar. A trial reduction was performed with a 1&5 mm X 32 mm head.The limb lengths were excellent the hip was stable in 90 of external rotation. At this point the trial components removed and we hammered into place a # 5 hi  Offset Actis stem with Gryption coating. A +5 mm x 32 ceramic head was then hammered into place. The hip was reduced and final C-arm images obtained. The wound was thoroughly irrigated with normal saline solution. We repaired the ant capsule and the tensor fascia lot a with running 0 vicryl suture. the subcutaneous tissue was closed with 2-0 and 3-0 Vicryl suture followed by an Aquacil dressing. At this point the patient was awaken and transferred to hospital gurney without difficulty.   Kerin Salen 09/15/2019, 6:56 AM

## 2019-09-15 NOTE — Evaluation (Signed)
Physical Therapy Evaluation Patient Details Name: Todd Cochran MRN: [id number] DOB: 02-Nov-1950 Today's Date: 09/15/2019   History of Present Illness  Patient is 69 y.o. male s/p RT THA anterior approach with PMH significant for HTN, COPD, GERD, OA, anxiety, Lt THA in 2018.    Clinical Impression  Todd Cochran is a 69 y.o. male POD 0 s/p Rt THA. Patient reports use of SPC and Rollator for mobility at baseline and that his girlfriend assists with all homemaking and set up for ADL's. Patient is now limited by functional impairments (see PT problem list below) and requires min assist for transfers and gait with RW. Patient was able to ambulate ~65 feet with RW and min assist. Patient instructed in exercise to facilitate ROM and circulation. Patient will benefit from continued skilled PT interventions to address impairments and progress towards PLOF. Acute PT will follow to progress mobility and stair training in preparation for safe discharge home.     Follow Up Recommendations Follow surgeon's recommendation for DC plan and follow-up therapies;Home health PT    Equipment Recommendations  None recommended by PT    Recommendations for Other Services       Precautions / Restrictions Precautions Precautions: Fall Restrictions Weight Bearing Restrictions: No      Mobility  Bed Mobility Overal bed mobility: Needs Assistance Bed Mobility: Supine to Sit     Supine to sit: HOB elevated;Min guard     General bed mobility comments: cues to use bed rail. pt able to raise trunk and pivot to EOB.  Transfers Overall transfer level: Needs assistance Equipment used: Rolling walker (2 wheeled) Transfers: Sit to/from Stand Sit to Stand: Min assist         General transfer comment: cues for safe hand placement/technique. pt required min assist to steady with rising; pt using LE's against bed to help with power up.   Ambulation/Gait Ambulation/Gait assistance: Min assist Gait Distance  (Feet): 65 Feet Assistive device: Rolling walker (2 wheeled) Gait Pattern/deviations: Step-to pattern;Decreased stride length;Narrow base of support;Trunk flexed Gait velocity: decreased   General Gait Details: cues for safe step pattern and proximity to RW. Assist required or safe walker management during turns and to deter stepping to close to front bar of walker.  Stairs            Wheelchair Mobility    Modified Rankin (Stroke Patients Only)       Balance Overall balance assessment: Needs assistance Sitting-balance support: Feet supported Sitting balance-Leahy Scale: Good   Postural control: Posterior lean(light posterior lean noted in standing) Standing balance support: During functional activity;Bilateral upper extremity supported Standing balance-Leahy Scale: Poor                Pertinent Vitals/Pain Pain Assessment: 0-10 Pain Score: 6  Pain Location: Rt hip Pain Descriptors / Indicators: Aching;Discomfort Pain Intervention(s): Limited activity within patient's tolerance;Monitored during session;Repositioned;Ice applied    Home Living Family/patient expects to be discharged to:: Private residence Living Arrangements: Spouse/significant other Available Help at Discharge: Friend(s) Type of Home: Apartment Home Access: Stairs to enter Entrance Stairs-Rails: Right Entrance Stairs-Number of Steps: 3 Home Layout: One level Home Equipment: [place] - 2 wheels;Walker - 4 wheels;Cane - single point;Shower seat;Bedside commode Additional Comments: p reports his girlfriend is his caregiver.    Prior Function Level of Independence: Needs assistance   Gait / Transfers Assistance Needed: uses Rollator for longer distance and community mobility and [facility] for household mobility.   ADL's / Homemaking Assistance Needed:  pt reports his girlfriend helps set up the sower seat and he is able to get in and shower himself. He dresses without assist. reports his girlfriend does  all homemaking.        Hand Dominance   Dominant Hand: Right    Extremity/Trunk Assessment   Upper Extremity Assessment Upper Extremity Assessment: Overall WFL for tasks assessed    Lower Extremity Assessment Lower Extremity Assessment: Overall WFL for tasks assessed    Cervical / Trunk Assessment Cervical / Trunk Assessment: Normal  Communication   Communication: No difficulties  Cognition Arousal/Alertness: Awake/alert Behavior During Therapy: WFL for tasks assessed/performed Overall Cognitive Status: Within Functional Limits for tasks assessed             General Comments General comments (skin integrity, edema, etc.): Pt destaurated to 84% on RA with gait. He reports he does not always wear his O2 at home but is supposed to be on 2L/min at rest and 3L/min with activity. Pt SpO2 increased to 98% on 3L/min at EOS.    Exercises Total Joint Exercises Ankle Circles/Pumps: AROM;Both;20 reps;Seated Quad Sets: AROM;Right;10 reps;Seated Heel Slides: AROM;Right;10 reps;Seated   Assessment/Plan    PT Assessment Patient needs continued PT services  PT Problem List Decreased strength;Decreased range of motion;Decreased activity tolerance;Decreased balance;Decreased mobility;Decreased safety awareness;Decreased knowledge of use of DME;Decreased knowledge of precautions       PT Treatment Interventions DME instruction;Gait training;Stair training;Functional mobility training;Therapeutic activities;Therapeutic exercise;Balance training;Patient/family education    PT Goals (Current goals can be found in the Care Plan section)  Acute Rehab PT Goals Patient Stated Goal: recover and be able to go fishing PT Goal Formulation: With patient Time For Goal Achievement: 09/20/19 Potential to Achieve Goals: Good    Frequency 7X/week    AM-PAC PT "6 Clicks" Mobility  Outcome Measure Help needed turning from your back to your side while in a flat bed without using bedrails?: A  Little Help needed moving from lying on your back to sitting on the side of a flat bed without using bedrails?: A Little Help needed moving to and from a bed to a chair (including a wheelchair)?: A Little Help needed standing up from a chair using your arms (e.g., wheelchair or bedside chair)?: A Little Help needed to walk in hospital room?: A Little Help needed climbing 3-5 steps with a railing? : A Little 6 Click Score: 18    End of Session Equipment Utilized During Treatment: Gait belt Activity Tolerance: Patient tolerated treatment well Patient left: in chair;with call bell/phone within reach;with chair alarm set Nurse Communication: Mobility status PT Visit Diagnosis: Muscle weakness (generalized) (M62.81);Difficulty in walking, not elsewhere classified (R26.2)    Time: 7169-6789 PT Time Calculation (min) (ACUTE ONLY): 21 min   Charges:   PT Evaluation $PT Eval Low Complexity: 1 Low          Wynn Maudlin, DPT Physical Therapist with [facility] [phone]  09/15/2019 3:03 PM

## 2019-09-15 NOTE — Discharge Instructions (Signed)

## 2019-09-15 NOTE — Anesthesia Procedure Notes (Signed)
Spinal  Patient location during procedure: OR Start time: 09/15/2019 7:18 AM End time: 09/15/2019 7:21 AM Staffing Performed: anesthesiologist  Anesthesiologist: Beryle Lathe, MD Preanesthetic Checklist Completed: patient identified, IV checked, risks and benefits discussed, surgical consent, monitors and equipment checked, pre-op evaluation and timeout performed Spinal Block Patient position: sitting Prep: DuraPrep Patient monitoring: heart rate, cardiac monitor, continuous pulse ox and blood pressure Approach: midline Location: L3-4 Injection technique: single-shot Needle Needle type: Pencan  Needle gauge: 24 G Additional Notes Consent was obtained prior to the procedure with all questions answered and concerns addressed. Risks including, but not limited to, bleeding, infection, nerve damage, paralysis, failed block, inadequate analgesia, allergic reaction, high spinal, itching, and headache were discussed and the patient wished to proceed. Functioning IV was confirmed and monitors were applied. Sterile prep and drape, including hand hygiene, mask, and sterile gloves were used. The patient was positioned and the spine was prepped. The skin was anesthetized with lidocaine. Free flow of clear CSF was obtained prior to injecting local anesthetic into the CSF. The spinal needle aspirated freely following injection. The needle was carefully withdrawn. The patient tolerated the procedure well.   Leslye Peer, MD

## 2019-09-15 NOTE — Transfer of Care (Signed)
Immediate Anesthesia Transfer of Care Note  Patient: Todd Cochran  Procedure(s) Performed: TOTAL HIP ARTHROPLASTY ANTERIOR APPROACH (Right Hip)  Patient Location: PACU  Anesthesia Type:Spinal  Level of Consciousness: awake, alert  and patient cooperative  Airway & Oxygen Therapy: Patient Spontanous Breathing and Patient connected to face mask oxygen  Post-op Assessment: Report given to RN and Post -op Vital signs reviewed and stable  Post vital signs: Reviewed and stable  Last Vitals:  Vitals Value Taken Time  BP 120/83 09/15/19 0901  Temp    Pulse 78 09/15/19 0903  Resp 15 09/15/19 0903  SpO2 100 % 09/15/19 0903  Vitals shown include unvalidated device data.  Last Pain:  Vitals:   09/15/19 0627  TempSrc: Oral         Complications: No apparent anesthesia complications

## 2019-09-16 ENCOUNTER — Encounter: Payer: Self-pay | Admitting: *Deleted

## 2019-09-16 DIAGNOSIS — M1611 Unilateral primary osteoarthritis, right hip: Secondary | ICD-10-CM | POA: Diagnosis not present

## 2019-09-16 LAB — CBC
HCT: 26.1 % — ABNORMAL LOW (ref 39.0–52.0)
Hemoglobin: 8.2 g/dL — ABNORMAL LOW (ref 13.0–17.0)
MCH: 27.4 pg (ref 26.0–34.0)
MCHC: 31.4 g/dL (ref 30.0–36.0)
MCV: 87.3 fL (ref 80.0–100.0)
Platelets: 225 10*3/uL (ref 150–400)
RBC: 2.99 MIL/uL — ABNORMAL LOW (ref 4.22–5.81)
RDW: 15.2 % (ref 11.5–15.5)
WBC: 16.1 10*3/uL — ABNORMAL HIGH (ref 4.0–10.5)
nRBC: 0 % (ref 0.0–0.2)

## 2019-09-16 LAB — BASIC METABOLIC PANEL
Anion gap: 7 (ref 5–15)
BUN: 16 mg/dL (ref 8–23)
CO2: 27 mmol/L (ref 22–32)
Calcium: 8.4 mg/dL — ABNORMAL LOW (ref 8.9–10.3)
Chloride: 103 mmol/L (ref 98–111)
Creatinine, Ser: 1.36 mg/dL — ABNORMAL HIGH (ref 0.61–1.24)
GFR calc Af Amer: >60 mL/min (ref >60)
GFR calc non Af Amer: 53 mL/min — ABNORMAL LOW (ref >60)
Glucose, Bld: 160 mg/dL — ABNORMAL HIGH (ref 70–99)
Potassium: 5 mmol/L (ref 3.5–5.1)
Sodium: 137 mmol/L (ref 135–145)

## 2019-09-16 MED ORDER — ALBUTEROL SULFATE (2.5 MG/3ML) 0.083% IN NEBU: 2.5000 mg | INHALATION_SOLUTION | Freq: Two times a day (BID) | RESPIRATORY_TRACT | Status: DC

## 2019-09-16 NOTE — Plan of Care (Signed)
resolved 

## 2019-09-16 NOTE — Plan of Care (Signed)
Plan of care reviewed and discussed with the patient. 

## 2019-09-16 NOTE — Progress Notes (Signed)
Physical Therapy Treatment Patient Details Name: Todd Cochran MRN: [id number] DOB: 1951-04-21 Today's Date: 09/16/2019    History of Present Illness Patient is 69 y.o. male s/p RT THA anterior approach with PMH significant for HTN, COPD, GERD, OA, anxiety, Lt THA in 2018.    PT Comments    Pt ambulated in hallway and performed LE exercises.  Pt fatigued quickly so will return for afternoon session and practice safe stair technique.  Pt anticipates d/c home later today.    Follow Up Recommendations  Follow surgeon's recommendation for DC plan and follow-up therapies;Home health PT     Equipment Recommendations  None recommended by PT    Recommendations for Other Services       Precautions / Restrictions Precautions Precautions: Fall Precaution Comments: chronic 2L O2 Restrictions Weight Bearing Restrictions: No    Mobility  Bed Mobility Overal bed mobility: Needs Assistance Bed Mobility: Supine to Sit     Supine to sit: Supervision;HOB elevated        Transfers Overall transfer level: Needs assistance Equipment used: Rolling walker (2 wheeled) Transfers: Sit to/from Stand Sit to Stand: Min guard         General transfer comment: cues for safe hand placement/technique  Ambulation/Gait Ambulation/Gait assistance: Min guard Gait Distance (Feet): 80 Feet Assistive device: Rolling walker (2 wheeled) Gait Pattern/deviations: Step-to pattern;Narrow base of support;Trunk flexed;Step-through pattern;Antalgic     General Gait Details: verbal cues for RW positioning, posture, distance to tolerance; ambulated on 2L O2 [place] and SPO2 94%   Stairs             Wheelchair Mobility    Modified Rankin (Stroke Patients Only)       Balance                                            Cognition Arousal/Alertness: Awake/alert Behavior During Therapy: WFL for tasks assessed/performed Overall Cognitive Status: Within Functional Limits for  tasks assessed                                        Exercises Total Joint Exercises Ankle Circles/Pumps: AROM;Both;10 reps Quad Sets: AROM;Both;10 reps Heel Slides: AROM;Right;10 reps Hip ABduction/ADduction: AROM;Right;Standing;Supine;10 reps Long Arc Quad: AROM;Right;10 reps;Seated Knee Flexion: AROM;Right;10 reps;Standing Marching in Standing: AROM;Right;Standing;10 reps Standing Hip Extension: AROM;Right;Standing;10 reps    General Comments        Pertinent Vitals/Pain Pain Assessment: 0-10 Pain Score: 4  Pain Location: Rt hip Pain Descriptors / Indicators: Aching;Discomfort Pain Intervention(s): Repositioned;Monitored during session    Home Living                      Prior Function            PT Goals (current goals can now be found in the care plan section) Progress towards PT goals: Progressing toward goals    Frequency    7X/week      PT Plan Current plan remains appropriate    Co-evaluation              AM-PAC PT "6 Clicks" Mobility   Outcome Measure  Help needed turning from your back to your side while in a flat bed without using bedrails?: A Little Help needed moving from lying on  your back to sitting on the side of a flat bed without using bedrails?: A Little Help needed moving to and from a bed to a chair (including a wheelchair)?: A Little Help needed standing up from a chair using your arms (Cochran.g., wheelchair or bedside chair)?: A Little Help needed to walk in hospital room?: A Little Help needed climbing 3-5 steps with a railing? : A Little 6 Click Score: 18    End of Session Equipment Utilized During Treatment: Gait belt;Oxygen Activity Tolerance: Patient limited by fatigue Patient left: in chair;with call bell/phone within reach;with chair alarm set   PT Visit Diagnosis: Muscle weakness (generalized) (M62.81);Difficulty in walking, not elsewhere classified (R26.2)     Time: 2671-2458 PT Time  Calculation (min) (ACUTE ONLY): 19 min  Charges:  $Therapeutic Exercise: 8-22 mins                     Todd Cochran, DPT Acute Rehabilitation Services Office: [phone]  Todd Cochran,Todd Cochran 09/16/2019, 11:00 AM

## 2019-09-16 NOTE — Progress Notes (Signed)
PATIENT ID: Todd Cochran  MRN: [id number]  DOB/AGE:  October 11, 1950 / 69 y.o.  1 Day Post-Op Procedure(s) (LRB): TOTAL HIP ARTHROPLASTY ANTERIOR APPROACH (Right)    PROGRESS NOTE Subjective: Patient is alert, oriented, no Nausea, no Vomiting, yes passing gas, . Taking PO well. Denies SOB, Chest or Calf Pain. Using Incentive Spirometer, PAS in place. Ambulate 90' Patient reports pain as  2/10  .    Objective: Vital signs in last 24 hours: Vitals:   09/15/19 1945 09/15/19 2159 09/16/19 0200 09/16/19 0733  BP:  (Abnormal) 123/92 (Abnormal) 129/91   Pulse:  100 92   Resp:  16 18   Temp:  98.7 F (37.1 C) 98.1 F (36.7 C)   TempSrc:  Oral    SpO2: 98% 93% 100% 99%  Weight:      Height:          Intake/Output from previous day: I/O last 3 completed shifts: In: 4344.3 [P.O.:540; I.V.:2979.3; Other:275; IV Piggyback:550] Out: 1500 [Urine:1375; Blood:125]   Intake/Output this shift: No intake/output data recorded.   LABORATORY DATA: Recent Labs    09/16/19 0331  WBC 16.1*  HGB 8.2*  HCT 26.1*  PLT 225  NA 137  K 5.0  CL 103  CO2 27  BUN 16  CREATININE 1.36*  GLUCOSE 160*  CALCIUM 8.4*    Examination: Neurologically intact ABD soft Neurovascular intact Sensation intact distally Intact pulses distally Dorsiflexion/Plantar flexion intact Incision: dressing C/D/I No cellulitis present Compartment soft} XR AP&Lat of hip shows well placed\fixed THA  Assessment:   1 Day Post-Op Procedure(s) (LRB): TOTAL HIP ARTHROPLASTY ANTERIOR APPROACH (Right) ADDITIONAL DIAGNOSIS:  Expected Acute Blood Loss Anemia, O2 dependent COPD, cardiomyopathy, Hep C  Patient's anticipated LOS is less than 2 midnights, meeting these requirements: - Younger than 96 - Lives within 1 hour of care - Has a competent adult at home to recover with post-op recover - NO history of  - Chronic pain requiring opiods  - Diabetes  - Coronary Artery Disease  - Heart failure  - Heart attack  -  Stroke  - DVT/VTE  - Cardiac arrhythmia  - Respiratory Failure/COPD  - Renal failure  - Anemia  - Advanced Liver disease       Plan: PT/OT WBAT, THA  DVT Prophylaxis: SCDx72 hrs, ASA 81 mg BID x 2 weeks  DISCHARGE PLAN: Home, today if passes PT  DISCHARGE NEEDS: HHPT, Walker and 3-in-1 comode seatPatient ID: Todd Cochran, adult   DOB: March 21, 1951, 68 y.o.   MRN: [id number]

## 2019-09-16 NOTE — TOC Transition Note (Signed)
Transition of Care [facility]) - CM/SW Discharge Note   Patient Details  Name: Todd Cochran MRN: [id number] Date of Birth: 07-20-1950  Transition of Care [facility]) CM/SW Contact:  Lennart Pall, LCSW Phone Number: 09/16/2019, 2:13 PM   Clinical Narrative:   Met with pt this morning to review d/c needs.  Has all needed DME from prior surgeries.  Pt reports he has moved to [place] [facility] and I have updated address in chart.  Referral for HHPT to [facility] ([place] office).  No further TOC needs.    Final next level of care: [place] Barriers to Discharge: No Barriers Identified   Patient Goals and CMS Choice Patient states their goals for this hospitalization and ongoing recovery are:: hopes to go home by tomorrow CMS Medicare.gov Compare Post Acute Care list provided to:: Patient Choice offered to / list presented to : Patient  Discharge Placement                       Discharge Plan and Services                DME Arranged: N/A(has all needed DME) DME Agency: NA       HH Arranged: PT HH Agency: [facility]([place] office) Date HH Agency Contacted: 09/16/19 Time [place]: 1200 Representative spoke with at [place]: Amy and Immunologist  Social Determinants of Health ([place]) Interventions     Readmission Risk Interventions No flowsheet data found.

## 2019-09-16 NOTE — Discharge Summary (Signed)
Patient ID: Todd Cochran MRN: [id number] DOB/AGE: 08/12/1950 69 y.o.  Admit date: 09/15/2019 Discharge date: 09/16/2019  Admission Diagnoses:  Principal Problem:   Osteoarthritis of right hip Active Problems:   History of total hip arthroplasty, right   Discharge Diagnoses:  Same  Past Medical History:  Diagnosis Date  . Anxiety   . Arthritis   . Cirrhosis ([place])    pt and family unaware  . COPD (chronic obstructive pulmonary disease) ([place])   . Dyspnea   . GERD (gastroesophageal reflux disease)   . Hepatitis C   . History of iron deficiency anemia   . Hypertension   . Pneumonia   . Pulmonary embolism ([place])    pt and girlfriend denies  . Spinal stenosis   . Vitamin D deficiency     Surgeries: Procedure(s): TOTAL HIP ARTHROPLASTY ANTERIOR APPROACH on 09/15/2019   Consultants:   Discharged Condition: Improved  Hospital Course: Todd Cochran is an 69 y.o. adult who was admitted 09/15/2019 for operative treatment ofOsteoarthritis of right hip. Patient has severe unremitting pain that affects sleep, daily activities, and work/hobbies. After pre-op clearance the patient was taken to the operating room on 09/15/2019 and underwent  Procedure(s): TOTAL HIP ARTHROPLASTY ANTERIOR APPROACH.    Patient was given perioperative antibiotics:  Anti-infectives (From admission, onward)   Start     Dose/Rate Route Frequency Ordered Stop   09/15/19 0600  ceFAZolin (ANCEF) IVPB 2g/100 mL premix     2 g 200 mL/hr over 30 Minutes Intravenous On call to O.R. 09/15/19 0531 09/15/19 7782       Patient was given sequential compression devices, early ambulation, and chemoprophylaxis to prevent DVT.  Patient benefited maximally from hospital stay and there were no complications.    Recent vital signs:  Patient Vitals for the past 24 hrs:  BP Temp Temp src Pulse Resp SpO2  09/16/19 1404 109/80 98.4 F (36.9 C) -- (!) 103 19 98 %  09/16/19 0934 130/89 98.6 F (37 C) -- (!) 103 18 100 %   09/16/19 0733 -- -- -- -- -- 99 %  09/16/19 0200 (!) 129/91 98.1 F (36.7 C) -- 92 18 100 %  09/15/19 2159 (!) 123/92 98.7 F (37.1 C) Oral 100 16 93 %  09/15/19 1945 -- -- -- -- -- 98 %     Recent laboratory studies:  Recent Labs    09/16/19 0331  WBC 16.1*  HGB 8.2*  HCT 26.1*  PLT 225  NA 137  K 5.0  CL 103  CO2 27  BUN 16  CREATININE 1.36*  GLUCOSE 160*  CALCIUM 8.4*     Discharge Medications:   Allergies as of 09/16/2019      Reactions   Ivp Dye [iodinated Diagnostic Agents] Anaphylaxis      Medication List    STOP taking these medications   cyclobenzaprine 10 MG tablet Commonly known as: FLEXERIL     TAKE these medications   albuterol 108 (90 Base) MCG/ACT inhaler Commonly known as: VENTOLIN HFA Inhale 2 puffs into the lungs every 6 (six) hours as needed for wheezing or shortness of breath.   albuterol (2.5 MG/3ML) 0.083% nebulizer solution Commonly known as: PROVENTIL Take 2.5 mg by nebulization in the morning, at noon, and at bedtime.   amitriptyline 50 MG tablet Commonly known as: ELAVIL Take 50 mg by mouth at bedtime.   aspirin EC 81 MG tablet Take 1 tablet (81 mg total) by mouth 2 (two) times daily. What changed:  medication strength  how much to take  Another medication with the same name was removed. Continue taking this medication, and follow the directions you see here.   atenolol 50 MG tablet Commonly known as: TENORMIN Take 50 mg by mouth every morning.   Centrum Silver 50+Men Tabs Take 1 tablet by mouth daily.   guaiFENesin 600 MG 12 hr tablet Commonly known as: MUCINEX Take 600 mg by mouth 2 (two) times daily.   hydrochlorothiazide 25 MG tablet Commonly known as: HYDRODIURIL Take 25 mg by mouth daily.   oxyCODONE 15 MG immediate release tablet Commonly known as: ROXICODONE Take 15 mg by mouth every 6 (six) hours as needed for pain.   oxyCODONE-acetaminophen 5-325 MG tablet Commonly known as: PERCOCET/ROXICET Take  1 tablet by mouth every 4 (four) hours as needed for severe pain.   OXYGEN Inhale 3 L into the lungs continuous.   pantoprazole 40 MG tablet Commonly known as: PROTONIX Take 40 mg by mouth 2 (two) times daily.   rosuvastatin 10 MG tablet Commonly known as: CRESTOR Take 10 mg by mouth every morning.   Sennosides 25 MG Tabs Take 50 mg by mouth every other day.   tamsulosin 0.4 MG Caps capsule Commonly known as: FLOMAX Take 0.4 mg by mouth in the morning and at bedtime.   tiZANidine 2 MG tablet Commonly known as: ZANAFLEX Take 1 tablet (2 mg total) by mouth every 6 (six) hours as needed.   Trelegy Ellipta 100-62.5-25 MCG/INH Aepb Generic drug: Fluticasone-Umeclidin-Vilant Inhale 1 puff into the lungs daily.   Vitamin D 125 MCG (5000 UT) Caps Take 5,000 Units by mouth every Friday.            Durable Medical Equipment  (From admission, onward)         Start     Ordered   09/15/19 1048  DME Walker rolling  Once    Question:  Patient needs a walker to treat with the following condition  Answer:  Status post right hip replacement   09/15/19 1047   09/15/19 1048  DME 3 n 1  Once     09/15/19 1047           Discharge Care Instructions  (From admission, onward)         Start     Ordered   09/16/19 0000  Weight bearing as tolerated     09/16/19 1623          Diagnostic Studies: DG Chest 2 View  Result Date: 09/11/2019 CLINICAL DATA:  Preop for hip surgery. EXAM: CHEST - 2 VIEW COMPARISON:  October 27, 2016. FINDINGS: The heart size and mediastinal contours are within normal limits. Both lungs are clear. The visualized skeletal structures are unremarkable. IMPRESSION: No active cardiopulmonary disease. Electronically Signed   By: Lupita Raider M.D.   On: 09/11/2019 09:52   DG C-Arm 1-60 Min-No Report  Result Date: 09/15/2019 Fluoroscopy was utilized by the requesting physician.  No radiographic interpretation.   DG HIP OPERATIVE UNILAT W OR W/O PELVIS  RIGHT  Result Date: 09/15/2019 CLINICAL DATA:  Total hip replacement EXAM: OPERATIVE RIGHT HIP (WITH PELVIS IF PERFORMED) 1 VIEWS TECHNIQUE: Fluoroscopic spot image(s) were submitted for interpretation post-operatively. COMPARISON:  September 29, 2015 FINDINGS: Frontal views obtained. There is a total hip replacement on the right with prosthetic components well-seated on frontal view. Incomplete visualization of a total hip replacement on the left also present with visualized prosthetic components well-seated. No fracture or dislocation  evident. IMPRESSION: Total hip replacements bilaterally with prosthetic components appearing well-seated on frontal view. No fracture or dislocation. Electronically Signed   By: Bretta Bang III M.D.   On: 09/15/2019 08:45    Disposition: Discharge disposition: 01-Home or Self Care       Discharge Instructions    Call MD / Call 911   Complete by: As directed    If you experience chest pain or shortness of breath, CALL 911 and be transported to the hospital emergency room.  If you develope a fever above 101 F, pus (white drainage) or increased drainage or redness at the wound, or calf pain, call your surgeon's office.   Constipation Prevention   Complete by: As directed    Drink plenty of fluids.  Prune juice may be helpful.  You may use a stool softener, such as Colace (over the counter) 100 mg twice a day.  Use MiraLax (over the counter) for constipation as needed.   Diet - low sodium heart healthy   Complete by: As directed    Driving restrictions   Complete by: As directed    No driving for 2 weeks   Increase activity slowly as tolerated   Complete by: As directed    Patient may shower   Complete by: As directed    You may shower without a dressing once there is no drainage.  Do not wash over the wound.  If drainage remains, cover wound with plastic wrap and then shower.   Weight bearing as tolerated   Complete by: As directed       Follow-up  Information    Gean Birchwood, MD In 2 weeks.   Specialty: Orthopedic Surgery Contact information: [address] [place] [address] [phone]            Signed: Dannielle Burn 09/16/2019, 4:23 PM

## 2019-09-16 NOTE — Progress Notes (Signed)
Physical Therapy Treatment Patient Details Name: Todd Cochran MRN: [id number] DOB: 02/20/1951 Today's Date: 09/16/2019    History of Present Illness Patient is 69 y.o. male s/p RT THA anterior approach with PMH significant for HTN, COPD, GERD, OA, anxiety, Lt THA in 2018.    PT Comments    Pt ambulated in hallway and practiced safe stair technique.  Pt remained on 2L O2 Todd Cochran.  Pt does fatigue quickly however overall min/guard for safety with mobility.  Provided and verbally reviewed HEP handout.  Pt eager for d/c home today.    Follow Up Recommendations  Follow surgeon's recommendation for DC plan and follow-up therapies;Home health PT     Equipment Recommendations  None recommended by PT    Recommendations for Other Services       Precautions / Restrictions Precautions Precautions: Fall Precaution Comments: chronic 2L O2 Restrictions Weight Bearing Restrictions: No    Mobility  Bed Mobility Overal bed mobility: Needs Assistance Bed Mobility: Supine to Sit;Sit to Supine     Supine to sit: Supervision;HOB elevated Sit to supine: Supervision;HOB elevated      Transfers Overall transfer level: Needs assistance Equipment used: Rolling walker (2 wheeled) Transfers: Sit to/from Stand Sit to Stand: Min guard         General transfer comment: cues for safe hand placement/technique  Ambulation/Gait Ambulation/Gait assistance: Min guard Gait Distance (Feet): 80 Feet Assistive device: Rolling walker (2 wheeled) Gait Pattern/deviations: Step-to pattern;Narrow base of support;Trunk flexed;Step-through pattern;Antalgic     General Gait Details: verbal cues for RW positioning, posture, distance to tolerance; ambulated on 2L O2 [place] Cochran, a little shaky with UEs however maintained balance with RW   Stairs Stairs: Yes Stairs assistance: Min guard Stair Management: Step to pattern;Forwards;Two rails;One rail Left Number of Stairs: 2 General stair comments: verbal cues for  safety and technique; performed incorrectly with step through pattern and unsteady however improved steadiness with step to pattern when performed second time; pt aware to have another person assist for safety   Wheelchair Mobility    Modified Rankin (Stroke Patients Only)       Balance                                            Cognition Arousal/Alertness: Awake/alert Behavior During Therapy: WFL for tasks assessed/performed Overall Cognitive Status: Within Functional Limits for tasks assessed                                        Exercises      General Comments        Pertinent Vitals/Pain Pain Assessment: 0-10 Pain Score: 5  Pain Location: Rt hip Pain Descriptors / Indicators: Aching;Discomfort Pain Intervention(s): Repositioned;Premedicated before session;Monitored during session    Home Living                      Prior Function            PT Goals (current goals can now be found in the care plan section) Progress towards PT goals: Progressing toward goals    Frequency           PT Plan Current plan remains appropriate    Co-evaluation  AM-PAC PT "6 Clicks" Mobility   Outcome Measure  Help needed turning from your back to your side while in a flat bed without using bedrails?: A Little Help needed moving from lying on your back to sitting on the side of a flat bed without using bedrails?: A Little Help needed moving to and from a bed to a chair (including a wheelchair)?: A Little Help needed standing up from a chair using your arms (e.g., wheelchair or bedside chair)?: A Little Help needed to walk in hospital room?: A Little Help needed climbing 3-5 steps with a railing? : A Little 6 Click Score: 18    End of Session Equipment Utilized During Treatment: Gait belt;Oxygen Activity Tolerance: Patient limited by fatigue Patient left: with call bell/phone within reach;in bed;with bed  alarm set   PT Visit Diagnosis: Muscle weakness (generalized) (M62.81);Difficulty in walking, not elsewhere classified (R26.2)     Time: 0165-8006 PT Time Calculation (min) (ACUTE ONLY): 11 min  Charges:  $Gait Training: 8-22 mins                    Paulino Door, DPT Acute Rehabilitation Services Office: [phone]   Sarajane Jews 09/16/2019, 3:23 PM

## 2020-07-30 DIAGNOSIS — I361 Nonrheumatic tricuspid (valve) insufficiency: Secondary | ICD-10-CM | POA: Diagnosis not present

## 2020-07-30 DIAGNOSIS — R55 Syncope and collapse: Secondary | ICD-10-CM

## 2020-07-31 DIAGNOSIS — I10 Essential (primary) hypertension: Secondary | ICD-10-CM

## 2020-07-31 DIAGNOSIS — R55 Syncope and collapse: Secondary | ICD-10-CM | POA: Diagnosis not present

## 2020-07-31 DIAGNOSIS — D649 Anemia, unspecified: Secondary | ICD-10-CM

## 2020-08-01 DIAGNOSIS — D649 Anemia, unspecified: Secondary | ICD-10-CM | POA: Diagnosis not present

## 2020-08-01 DIAGNOSIS — R55 Syncope and collapse: Secondary | ICD-10-CM | POA: Diagnosis not present

## 2020-08-01 DIAGNOSIS — I10 Essential (primary) hypertension: Secondary | ICD-10-CM | POA: Diagnosis not present

## 2020-08-16 DIAGNOSIS — J45909 Unspecified asthma, uncomplicated: Secondary | ICD-10-CM | POA: Insufficient documentation

## 2020-08-16 DIAGNOSIS — D649 Anemia, unspecified: Secondary | ICD-10-CM | POA: Insufficient documentation

## 2020-08-16 DIAGNOSIS — R55 Syncope and collapse: Secondary | ICD-10-CM | POA: Insufficient documentation

## 2020-08-16 DIAGNOSIS — M1990 Unspecified osteoarthritis, unspecified site: Secondary | ICD-10-CM | POA: Insufficient documentation

## 2020-08-16 DIAGNOSIS — R0600 Dyspnea, unspecified: Secondary | ICD-10-CM | POA: Insufficient documentation

## 2020-08-16 DIAGNOSIS — K222 Esophageal obstruction: Secondary | ICD-10-CM | POA: Insufficient documentation

## 2020-08-16 DIAGNOSIS — B1920 Unspecified viral hepatitis C without hepatic coma: Secondary | ICD-10-CM | POA: Insufficient documentation

## 2020-08-16 DIAGNOSIS — J449 Chronic obstructive pulmonary disease, unspecified: Secondary | ICD-10-CM | POA: Insufficient documentation

## 2020-08-16 DIAGNOSIS — J189 Pneumonia, unspecified organism: Secondary | ICD-10-CM | POA: Insufficient documentation

## 2020-08-16 DIAGNOSIS — K7460 Unspecified cirrhosis of liver: Secondary | ICD-10-CM | POA: Insufficient documentation

## 2020-08-16 DIAGNOSIS — I10 Essential (primary) hypertension: Secondary | ICD-10-CM | POA: Insufficient documentation

## 2020-08-16 DIAGNOSIS — K219 Gastro-esophageal reflux disease without esophagitis: Secondary | ICD-10-CM | POA: Insufficient documentation

## 2020-08-16 DIAGNOSIS — F419 Anxiety disorder, unspecified: Secondary | ICD-10-CM | POA: Insufficient documentation

## 2020-08-16 DIAGNOSIS — E559 Vitamin D deficiency, unspecified: Secondary | ICD-10-CM | POA: Insufficient documentation

## 2020-08-16 DIAGNOSIS — E785 Hyperlipidemia, unspecified: Secondary | ICD-10-CM | POA: Insufficient documentation

## 2020-08-16 DIAGNOSIS — M4800 Spinal stenosis, site unspecified: Secondary | ICD-10-CM | POA: Insufficient documentation

## 2020-08-16 DIAGNOSIS — Z862 Personal history of diseases of the blood and blood-forming organs and certain disorders involving the immune mechanism: Secondary | ICD-10-CM | POA: Insufficient documentation

## 2020-08-19 ENCOUNTER — Other Ambulatory Visit: Payer: Self-pay

## 2020-08-19 ENCOUNTER — Encounter: Payer: Self-pay | Admitting: Cardiology

## 2020-08-19 ENCOUNTER — Ambulatory Visit (INDEPENDENT_AMBULATORY_CARE_PROVIDER_SITE_OTHER): Payer: 59

## 2020-08-19 ENCOUNTER — Ambulatory Visit (INDEPENDENT_AMBULATORY_CARE_PROVIDER_SITE_OTHER): Payer: 59 | Admitting: Cardiology

## 2020-08-19 VITALS — BP 108/76 | HR 90 | Ht 66.0 in | Wt 123.0 lb

## 2020-08-19 DIAGNOSIS — R55 Syncope and collapse: Secondary | ICD-10-CM

## 2020-08-19 DIAGNOSIS — I255 Ischemic cardiomyopathy: Secondary | ICD-10-CM

## 2020-08-19 DIAGNOSIS — E782 Mixed hyperlipidemia: Secondary | ICD-10-CM

## 2020-08-19 DIAGNOSIS — J410 Simple chronic bronchitis: Secondary | ICD-10-CM | POA: Diagnosis not present

## 2020-08-19 DIAGNOSIS — I10 Essential (primary) hypertension: Secondary | ICD-10-CM

## 2020-08-19 NOTE — Progress Notes (Signed)
Cardiology Office Note:    Date:  08/19/2020   ID:  NOX TALENT, DOB 1950-12-21, MRN [id number]  PCP:  Quitman Livings, MD  Cardiologist:  Gypsy Balsam, MD    Referring MD: Quitman Livings, MD   Chief Complaint  Patient presents with  . Chest Pain    Was unresponsive 2 weeks ago where CPR was performed, was taken to [facility]. C/o now of pain around his chest and rib describe as sharp pain.  He's been having issue Low BP  Syncope  History of Present Illness:    Todd Cochran is a 70 y.o. adult past medical history significant for advanced COPD, essential hypertension, dyslipidemia.  Recently he was in the hospital because of episode of syncope apparently he was in a restaurant with his girlfriend he got up he started walking became dizzy and fell down or passed out.  He did have CPR done.  When EMS came he already had a rhythm and started waking up.  Work-up in the hospital was negative.  Thinking was that his episode of passing out was probably related to severe COPD, lack of oxygen as well as significant anemia.  He did require blood transfusion while in the hospital.  Echocardiogram done in the hospital showed ejection fraction 50 to 55%, he did not have any arrhythmia and actually cardiac work-up has been negative. He comes today 2 months for follow-up overall he is doing poorly he does have advanced COPD he cannot do much without getting shortness of breath.  He is planning to go to the beach with his girlfriend but he knows that he will not be able to do much.  They plan to take [place] and move him around on the wheelchair.  Past Medical History:  Diagnosis Date  . Anemia   . Anxiety   . Arthritis   . Asthma   . Cervical spondylosis 01/27/2014  . Cirrhosis (HCC)    pt and family unaware  . COPD (chronic obstructive pulmonary disease) (HCC)   . COPD with asthma (HCC) 10/05/2015  . Dilated cardiomyopathy (HCC) 05/03/2016  . Dizziness 12/30/2013  . Dyspnea   . Esophageal stricture   .  Essential hypertension 08/19/2012  . GERD (gastroesophageal reflux disease)   . Hepatic cirrhosis (HCC) 10/05/2015  . Hepatitis C   . Hepatitis C infection 08/19/2012  . History of iron deficiency anemia   . History of total hip arthroplasty, right 09/15/2019  . Hyperlipidemia   . Hypertension   . Insomnia 12/30/2013  . Ischemic cardiomyopathy 04/26/2016   Overview:  Added automatically from request for surgery [id number]  Formatting of this note might be different from the original. Added automatically from request for surgery [id number]  . Low back pain 12/30/2013   Overview:  IMO Problem List Replacer Jan. 2016  Formatting of this note might be different from the original. IMO Problem List Replacer Jan. 2016  . Osteoarthritis of left hip 11/03/2016  . Osteoarthritis of right hip 09/12/2019  . Pneumonia   . Preoperative cardiovascular examination 03/09/2016  . Primary osteoarthritis of left hip 09/29/2015  . Pulmonary embolism (HCC)    pt and girlfriend denies  . Raised antibody titer 10/05/2015   Formatting of this note might be different from the original. HCV treated. Achieved SVR 10-01-15  . Shortness of breath 10/24/2014  . Spinal stenosis   . Spinal stenosis at L4-L5 level 12/30/2013  . Syncope   . Thoracic or lumbosacral neuritis or radiculitis 10/06/2011  . Vitamin  D deficiency     Past Surgical History:  Procedure Laterality Date  . ELBOW SURGERY Right 1985  . ESOPHAGEAL DILATION     x3  . LEG SURGERY     screws,plates  . TOTAL HIP ARTHROPLASTY Left 11/08/2016   Procedure: TOTAL HIP ARTHROPLASTY ANTERIOR APPROACH REMOVAL OF HARDWARE;  Surgeon: Gean Birchwood, MD;  Location: [facility];  Service: Orthopedics;  Laterality: Left;  . TOTAL HIP ARTHROPLASTY Right 09/15/2019   Procedure: TOTAL HIP ARTHROPLASTY ANTERIOR APPROACH;  Surgeon: Gean Birchwood, MD;  Location: [facility];  Service: Orthopedics;  Laterality: Right;    Current Medications: Current Meds  Medication Sig  . acetaminophen (TYLENOL) 325  MG tablet Take 650 mg by mouth every 6 (six) hours as needed for pain.  Marland Kitchen albuterol (PROVENTIL HFA;VENTOLIN HFA) 108 (90 Base) MCG/ACT inhaler Inhale 2 puffs into the lungs every 6 (six) hours as needed for wheezing or shortness of breath.  Marland Kitchen albuterol (PROVENTIL) (2.5 MG/3ML) 0.083% nebulizer solution Take 2.5 mg by nebulization in the morning, at noon, and at bedtime.  Marland Kitchen amitriptyline (ELAVIL) 50 MG tablet Take 50 mg by mouth at bedtime.  Marland Kitchen aspirin EC 81 MG tablet Take 1 tablet (81 mg total) by mouth 2 (two) times daily. (Patient taking differently: Take 81 mg by mouth daily.)  . atenolol (TENORMIN) 50 MG tablet Take 50 mg by mouth every morning.   . bisacodyl (DULCOLAX) 5 MG EC tablet Take 5 mg by mouth every other day.  . Cholecalciferol (VITAMIN D) 125 MCG (5000 UT) CAPS Take 1,250 mcg by mouth every Friday.  . cyclobenzaprine (FLEXERIL) 10 MG tablet Take 10 mg by mouth at bedtime as needed for muscle spasms.  Marland Kitchen guaiFENesin (MUCINEX) 600 MG 12 hr tablet Take 600 mg by mouth 2 (two) times daily.  . hydrochlorothiazide (HYDRODIURIL) 25 MG tablet Take 25 mg by mouth daily.  . Multiple Vitamins-Minerals (CENTRUM SILVER 50+MEN) TABS Take 1 tablet by mouth daily. Unknown strength  . oxyCODONE (ROXICODONE) 15 MG immediate release tablet Take 15 mg by mouth every 4 (four) hours as needed for pain.  . OXYGEN Inhale 3 L into the lungs continuous.  . pantoprazole (PROTONIX) 40 MG tablet Take 40 mg by mouth 2 (two) times daily.  . rosuvastatin (CRESTOR) 10 MG tablet Take 10 mg by mouth every morning.   . tamsulosin (FLOMAX) 0.4 MG CAPS capsule Take 0.4 mg by mouth in the morning and at bedtime.   . TRELEGY ELLIPTA 100-62.5-25 MCG/INH AEPB Inhale 1 puff into the lungs daily.     Allergies:   Ivp dye [iodinated diagnostic agents]   Social History   Socioeconomic History  . Marital status: Single    Spouse name: Not on file  . Number of children: Not on file  . Years of education: Not on file  .  Highest education level: Not on file  Occupational History  . Not on file  Tobacco Use  . Smoking status: Former Smoker    Years: 50.00  . Smokeless tobacco: Never Used  Vaping Use  . Vaping Use: Never used  Substance and Sexual Activity  . Alcohol use: No    Comment: stopped 30 yrs  . Drug use: Yes    Types: Cocaine, "Crack" cocaine    Comment: stopped 2014  . Sexual activity: Not on file  Other Topics Concern  . Not on file  Social History Narrative  . Not on file   Social Determinants of Health   Financial Resource  Strain: Not on file  Food Insecurity: Not on file  Transportation Needs: Not on file  Physical Activity: Not on file  Stress: Not on file  Social Connections: Not on file     Family History: The patient's family history includes Asthma in his sister; Cancer in his brother; Diabetes in his mother; High blood pressure in his mother. ROS:   Please see the history of present illness.    All 14 point review of systems negative except as described per history of present illness  EKGs/Labs/Other Studies Reviewed:      Recent Labs: 09/16/2019: BUN 16; Creatinine, Ser 1.36; Hemoglobin 8.2; Platelets 225; Potassium 5.0; Sodium 137  Recent Lipid Panel No results found for: CHOL, TRIG, HDL, CHOLHDL, VLDL, LDLCALC, LDLDIRECT  Physical Exam:    VS:  BP 108/76 (BP Location: Right Arm, Patient Position: Sitting)   Pulse 90   Ht 5\' 6"  (1.676 m)   Wt 123 lb (55.8 kg)   SpO2 97%   BMI 19.85 kg/m     Wt Readings from Last 3 Encounters:  08/19/20 123 lb (55.8 kg)  09/15/19 124 lb 1.9 oz (56.3 kg)  09/11/19 124 lb (56.2 kg)     GEN:  Well nourished, well developed in no acute distress HEENT: Normal NECK: No JVD; No carotid bruits LYMPHATICS: No lymphadenopathy CARDIAC: RRR, no murmurs, no rubs, no gallops RESPIRATORY:  Clear to auscultation without rales, wheezing or rhonchi  ABDOMEN: Soft, non-tender, non-distended MUSCULOSKELETAL:  No edema; No deformity   SKIN: Warm and dry LOWER EXTREMITIES: no swelling NEUROLOGIC:  Alert and oriented x 3 PSYCHIATRIC:  Normal affect   ASSESSMENT:    1. Syncope, unspecified syncope type   2. Simple chronic bronchitis (HCC)   3. Ischemic cardiomyopathy   4. Essential hypertension   5. Mixed hyperlipidemia    PLAN:    In order of problems listed above:  1. Syncope most likely related to significant COPD, hypoxia as well as significant anemia.  I must rule out an arrhythmia.  I will put Zio patch on him for 2 weeks.  Likely his left ventricle ejection fraction is only lower limits of normal not significantly diminished therefore chance of arrhythmia is less.  But need to be rule out. 2. COPD advanced followed by pulmonary team. 3. History of cardiomyopathy however last ejection fraction 50 to 55% we will continue present management. 4. Essential hypertension blood pressure well controlled continue present management. 5. Dyslipidemia.  I do not have his fasting lipid profile will make arrangement for the test. 6. Chest pain located on the left side of her chest reproducible by pressing chest wall this chest pain is there is since he did have CPR.  He did have x-ray done of the chest in the hospital to rule out fracture and I will find a suspect he simply got bruising of his ribs.  He denies having any typical exertional chest pain tightness squeezing pressure burning chest.   Medication Adjustments/Labs and Tests Ordered: Current medicines are reviewed at length with the patient today.  Concerns regarding medicines are outlined above.  Orders Placed This Encounter  Procedures  . LONG TERM MONITOR (3-14 DAYS)   Medication changes: No orders of the defined types were placed in this encounter.   Signed, 09/13/19, MD, [facility] 08/19/2020 4:30 PM    [facility]

## 2020-08-19 NOTE — Patient Instructions (Signed)
Medication Instructions:  Your physician recommends that you continue on your current medications as directed. Please refer to the Current Medication list given to you today.  *If you need a refill on your cardiac medications before your next appointment, please call your pharmacy*   Lab Work: none If you have labs (blood work) drawn today and your tests are completely normal, you will receive your results only by: Marland Kitchen MyChart Message (if you have MyChart) OR . A paper copy in the mail If you have any lab test that is abnormal or we need to change your treatment, we will call you to review the results.   Testing/Procedures: A zio monitor was ordered today. It will remain on for 14 days. You will then return monitor and event diary in provided box. It takes 1-2 weeks for report to be downloaded and returned to [place]. We will call you with the results. If monitor falls off or has orange flashing light, please call Zio for further instructions.      Follow-Up: At [facility], you and your health needs are our priority.  As part of our continuing mission to provide you with exceptional heart care, we have created designated Provider Care Teams.  These Care Teams include your primary Cardiologist (physician) and Advanced Practice Providers (APPs -  Physician Assistants and Nurse Practitioners) who all work together to provide you with the care you need, when you need it.  We recommend signing up for the patient portal called "MyChart".  Sign up information is provided on this After Visit Summary.  MyChart is used to connect with patients for Virtual Visits (Telemedicine).  Patients are able to view lab/test results, encounter notes, upcoming appointments, etc.  Non-urgent messages can be sent to your provider as well.   To learn more about what you can do with MyChart, go to [email or web address].    Your next appointment:   2 month(s)  The format for your next appointment:   In  Person  Provider:   Gypsy Balsam, MD   Other Instructions

## 2020-10-07 DIAGNOSIS — G609 Hereditary and idiopathic neuropathy, unspecified: Secondary | ICD-10-CM | POA: Insufficient documentation

## 2020-10-27 ENCOUNTER — Other Ambulatory Visit: Payer: Self-pay

## 2020-10-28 ENCOUNTER — Encounter: Payer: Self-pay | Admitting: Cardiology

## 2020-10-28 ENCOUNTER — Ambulatory Visit (INDEPENDENT_AMBULATORY_CARE_PROVIDER_SITE_OTHER): Payer: 59 | Admitting: Cardiology

## 2020-10-28 ENCOUNTER — Other Ambulatory Visit: Payer: Self-pay

## 2020-10-28 VITALS — BP 98/54 | HR 76 | Ht 66.0 in | Wt 119.2 lb

## 2020-10-28 DIAGNOSIS — I420 Dilated cardiomyopathy: Secondary | ICD-10-CM

## 2020-10-28 DIAGNOSIS — I10 Essential (primary) hypertension: Secondary | ICD-10-CM

## 2020-10-28 DIAGNOSIS — J410 Simple chronic bronchitis: Secondary | ICD-10-CM | POA: Diagnosis not present

## 2020-10-28 NOTE — Progress Notes (Signed)
Kg   

## 2020-10-28 NOTE — Progress Notes (Signed)
2 Cardiology Office Note:    Date:  10/28/2020   ID:  Todd Cochran, DOB 08/24/1950, MRN [id number]  PCP:  Antonietta Jewel, MD  Cardiologist:  Todd Campus, MD    Referring MD: Antonietta Jewel, MD   No chief complaint on file. I am losing weight  History of Present Illness:    Todd Cochran is a 70 y.o. adult with past medical history significant for advanced COPD, oxygen dependent, essential hypertension, dyslipidemia.  Also have remote history of cardiomyopathy with latest ejection fraction 5055% which was done by echocardiogram in the hospital.  I met him first time in the hospital when he came there because of episode of syncope.  It was felt to be related to advanced COPD and hypoxia, echocardiogram done showed preserved ejection fraction, monitor recently done showing no significant arrhythmia.  He comes today 2 months of follow-up.  He denies having any chest pain tightness squeezing pressure burning chest no syncope.  Of course concern is he is very advanced COPD.  He also got problem with swallowing.  Apparently he got recurrent esophageal strictures he got multiple times procedures done to relieve that however every single time very quickly after that he still having problems again.  Cardiac wise denies have any syncope passing out no chest pain tightness squeezing pressure burning chest.  He leads a very sedentary lifestyle he is not able to do anything.  Past Medical History:  Diagnosis Date   Anemia    Anxiety    Arthritis    Asthma    Cervical spondylosis 01/27/2014   Cirrhosis ([place])    pt and family unaware   COPD (chronic obstructive pulmonary disease) ([place])    COPD with asthma ([place]) 10/05/2015   Dilated cardiomyopathy (Todd Cochran) 05/03/2016   Dizziness 12/30/2013   Dyspnea    Esophageal stricture    Essential hypertension 08/19/2012   GERD (gastroesophageal reflux disease)    Hepatic cirrhosis ([place]) 10/05/2015   Hepatitis C    Hepatitis C infection 08/19/2012   History of iron  deficiency anemia    History of total hip arthroplasty, right 09/15/2019   Hyperlipidemia    Hypertension    Insomnia 12/30/2013   Ischemic cardiomyopathy 04/26/2016   Overview:  Added automatically from request for surgery [id number]  Formatting of this note might be different from the original. Added automatically from request for surgery [id number]   Low back pain 12/30/2013   Overview:  IMO Problem List Replacer Jan. 2016  Formatting of this note might be different from the original. IMO Problem List Replacer Jan. 2016   Osteoarthritis of left hip 11/03/2016   Osteoarthritis of right hip 09/12/2019   Pneumonia    Preoperative cardiovascular examination 03/09/2016   Primary osteoarthritis of left hip 09/29/2015   Pulmonary embolism ([place])    pt and girlfriend denies   Raised antibody titer 10/05/2015   Formatting of this note might be different from the original. HCV treated. Achieved SVR 10-01-15   Shortness of breath 10/24/2014   Spinal stenosis    Spinal stenosis at L4-L5 level 12/30/2013   Syncope    Thoracic or lumbosacral neuritis or radiculitis 10/06/2011   Vitamin D deficiency     Past Surgical History:  Procedure Laterality Date   ELBOW SURGERY Right 1985   ESOPHAGEAL DILATION     x3   LEG SURGERY     screws,plates   TOTAL HIP ARTHROPLASTY Left 11/08/2016   Procedure: TOTAL HIP ARTHROPLASTY ANTERIOR APPROACH REMOVAL OF HARDWARE;  Surgeon: Todd Pear, MD;  Location: [place];  Service: Orthopedics;  Laterality: Left;   TOTAL HIP ARTHROPLASTY Right 09/15/2019   Procedure: TOTAL HIP ARTHROPLASTY ANTERIOR APPROACH;  Surgeon: Todd Pear, MD;  Location: [facility];  Service: Orthopedics;  Laterality: Right;    Current Medications: No outpatient medications have been marked as taking for the 10/28/20 encounter (Office Visit) with Park Liter, MD.     Allergies:   Ivp dye [iodinated diagnostic agents]   Social History   Socioeconomic History   Marital status: Single    Spouse name: Not on  file   Number of children: Not on file   Years of education: Not on file   Highest education level: Not on file  Occupational History   Not on file  Tobacco Use   Smoking status: Former    Years: 50.00    Pack years: 0.00    Types: Cigarettes   Smokeless tobacco: Never  Vaping Use   Vaping Use: Never used  Substance and Sexual Activity   Alcohol use: No    Comment: stopped 30 yrs   Drug use: Yes    Types: Cocaine, "Crack" cocaine    Comment: stopped 2014   Sexual activity: Not on file  Other Topics Concern   Not on file  Social History Narrative   Not on file   Social Determinants of Health   Financial Resource Strain: Not on file  Food Insecurity: Not on file  Transportation Needs: Not on file  Physical Activity: Not on file  Stress: Not on file  Social Connections: Not on file     Family History: The patient's family history includes Asthma in his sister; Cancer in his brother; Diabetes in his mother; High blood pressure in his mother. ROS:   Please see the history of present illness.    All 14 point review of systems negative except as described per history of present illness  EKGs/Labs/Other Studies Reviewed:      Recent Labs: No results found for requested labs within last 8760 hours.  Recent Lipid Panel No results found for: CHOL, TRIG, HDL, CHOLHDL, VLDL, LDLCALC, LDLDIRECT  Physical Exam:    VS:  BP (!) 98/54 (BP Location: Left Arm, Patient Position: Sitting)   Pulse 76   Ht 5' 6"  (1.676 m)   Wt 119 lb 3.2 oz (54.1 kg)   SpO2 97%   BMI 19.24 kg/m     Wt Readings from Last 3 Encounters:  10/28/20 119 lb 3.2 oz (54.1 kg)  08/19/20 123 lb (55.8 kg)  09/15/19 124 lb 1.9 oz (56.3 kg)     GEN: Almost cachectic appearing, in mild respiratory distress wearing oxygen HEENT: Normal NECK: No JVD; No carotid bruits LYMPHATICS: No lymphadenopathy CARDIAC: RRR, no murmurs, no rubs, no gallops RESPIRATORY: Lungs are overinflated, few wheezes ABDOMEN:  Soft, non-tender, non-distended MUSCULOSKELETAL:  No edema; No deformity  SKIN: Warm and dry LOWER EXTREMITIES: no swelling NEUROLOGIC:  Alert and oriented x 3 PSYCHIATRIC:  Normal affect   ASSESSMENT:    1. Dilated cardiomyopathy ([place])   2. Simple chronic bronchitis ([place])   3. Primary hypertension    PLAN:    In order of problems listed above:  Dilated cardiomyopathy.  Her ejection fraction less than 5055% just few months ago in the hospital.  No room to put him on any additional medication because of low blood pressure. Syncope felt to be related to hypoxia.  Monitor did not show any significant arrhythmia. Essential  hypertension on IV facing up with probably his blood pressure being low. Advanced COPD followed by pulmonary team.  Overall he is in advance condition.  He does have problem swallowing.  He is losing some weight there is a plans to do another stretching of his esophagus.   Medication Adjustments/Labs and Tests Ordered: Current medicines are reviewed at length with the patient today.  Concerns regarding medicines are outlined above.  No orders of the defined types were placed in this encounter.  Medication changes: No orders of the defined types were placed in this encounter.   Signed, Park Liter, MD, [facility] 10/28/2020 11:26 AM    [place]

## 2020-10-28 NOTE — Patient Instructions (Signed)

## 2023-06-22 DIAGNOSIS — R55 Syncope and collapse: Secondary | ICD-10-CM | POA: Diagnosis not present

## 2023-06-23 DIAGNOSIS — I5032 Chronic diastolic (congestive) heart failure: Secondary | ICD-10-CM | POA: Diagnosis not present

## 2023-06-23 DIAGNOSIS — D72823 Leukemoid reaction: Secondary | ICD-10-CM | POA: Diagnosis not present

## 2023-06-23 DIAGNOSIS — R55 Syncope and collapse: Secondary | ICD-10-CM | POA: Diagnosis not present

## 2023-06-28 ENCOUNTER — Other Ambulatory Visit: Payer: Self-pay | Admitting: Oncology

## 2023-06-28 ENCOUNTER — Encounter: Payer: Self-pay | Admitting: Oncology

## 2023-06-28 DIAGNOSIS — M4850XA Collapsed vertebra, not elsewhere classified, site unspecified, initial encounter for fracture: Secondary | ICD-10-CM | POA: Insufficient documentation

## 2023-06-28 DIAGNOSIS — D72829 Elevated white blood cell count, unspecified: Secondary | ICD-10-CM | POA: Insufficient documentation

## 2023-06-28 DIAGNOSIS — M899 Disorder of bone, unspecified: Secondary | ICD-10-CM

## 2023-06-28 DIAGNOSIS — Z7952 Long term (current) use of systemic steroids: Secondary | ICD-10-CM | POA: Insufficient documentation

## 2023-06-28 DIAGNOSIS — S12490D Other displaced fracture of fifth cervical vertebra, subsequent encounter for fracture with routine healing: Secondary | ICD-10-CM

## 2023-06-28 DIAGNOSIS — R911 Solitary pulmonary nodule: Secondary | ICD-10-CM | POA: Insufficient documentation

## 2023-06-28 DIAGNOSIS — D72823 Leukemoid reaction: Secondary | ICD-10-CM

## 2023-07-02 ENCOUNTER — Telehealth: Payer: Self-pay | Admitting: Oncology

## 2023-07-02 NOTE — Telephone Encounter (Signed)
 07/02/23 Spoke with patients gf(Stephanie)and confirmed next appts.

## 2023-07-27 ENCOUNTER — Other Ambulatory Visit

## 2023-07-27 ENCOUNTER — Ambulatory Visit: Admitting: Oncology

## 2023-07-31 ENCOUNTER — Inpatient Hospital Stay: Admitting: Oncology

## 2023-07-31 ENCOUNTER — Inpatient Hospital Stay: Attending: Internal Medicine

## 2023-07-31 DIAGNOSIS — D649 Anemia, unspecified: Secondary | ICD-10-CM
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
## 2023-08-09 ENCOUNTER — Ambulatory Visit: Admitting: Cardiology

## 2023-12-10 ENCOUNTER — Encounter: Payer: Self-pay | Admitting: Neurology

## 2024-02-25 ENCOUNTER — Ambulatory Visit: Admitting: Neurology

## 2024-02-26 ENCOUNTER — Ambulatory Visit: Admitting: Neurology
